# Patient Record
Sex: Female | Born: 1965 | Race: White | Hispanic: No | Marital: Married | Smoking: Never smoker
Health system: Southern US, Community
[De-identification: ages and names within clinical notes are randomized; demographics above are authoritative.]

## PROBLEM LIST (undated history)

## (undated) DIAGNOSIS — J45909 Unspecified asthma, uncomplicated: Secondary | ICD-10-CM

## (undated) HISTORY — DX: Unspecified asthma, uncomplicated: J45.909

---

## 2005-10-30 ENCOUNTER — Ambulatory Visit: Payer: Self-pay

## 2013-01-27 ENCOUNTER — Emergency Department: Payer: Self-pay | Admitting: Emergency Medicine

## 2014-06-01 LAB — HM PAP SMEAR: HM Pap smear: NEGATIVE

## 2014-07-02 ENCOUNTER — Ambulatory Visit: Payer: Self-pay

## 2014-07-02 LAB — HM MAMMOGRAPHY: HM Mammogram: NEGATIVE

## 2014-08-31 DIAGNOSIS — J4520 Mild intermittent asthma, uncomplicated: Secondary | ICD-10-CM | POA: Insufficient documentation

## 2015-09-07 ENCOUNTER — Encounter: Payer: Self-pay | Admitting: Family Medicine

## 2015-09-07 ENCOUNTER — Ambulatory Visit (INDEPENDENT_AMBULATORY_CARE_PROVIDER_SITE_OTHER): Payer: BLUE CROSS/BLUE SHIELD | Admitting: Family Medicine

## 2015-09-07 VITALS — BP 133/82 | HR 81 | Temp 98.4°F | Ht 59.4 in | Wt 207.0 lb

## 2015-09-07 DIAGNOSIS — F329 Major depressive disorder, single episode, unspecified: Secondary | ICD-10-CM | POA: Diagnosis not present

## 2015-09-07 DIAGNOSIS — F32A Depression, unspecified: Secondary | ICD-10-CM

## 2015-09-07 DIAGNOSIS — G4700 Insomnia, unspecified: Secondary | ICD-10-CM | POA: Diagnosis not present

## 2015-09-07 DIAGNOSIS — Z13220 Encounter for screening for lipoid disorders: Secondary | ICD-10-CM | POA: Diagnosis not present

## 2015-09-07 DIAGNOSIS — Z1239 Encounter for other screening for malignant neoplasm of breast: Secondary | ICD-10-CM

## 2015-09-07 DIAGNOSIS — R51 Headache: Secondary | ICD-10-CM

## 2015-09-07 DIAGNOSIS — R5382 Chronic fatigue, unspecified: Secondary | ICD-10-CM

## 2015-09-07 DIAGNOSIS — Z113 Encounter for screening for infections with a predominantly sexual mode of transmission: Secondary | ICD-10-CM | POA: Diagnosis not present

## 2015-09-07 DIAGNOSIS — R0683 Snoring: Secondary | ICD-10-CM | POA: Diagnosis not present

## 2015-09-07 DIAGNOSIS — Z1211 Encounter for screening for malignant neoplasm of colon: Secondary | ICD-10-CM | POA: Diagnosis not present

## 2015-09-07 DIAGNOSIS — E669 Obesity, unspecified: Secondary | ICD-10-CM | POA: Diagnosis not present

## 2015-09-07 DIAGNOSIS — R519 Headache, unspecified: Secondary | ICD-10-CM

## 2015-09-07 NOTE — Patient Instructions (Addendum)
Please come in in 4-6 weeks for physical. Please come in fasting so we can check your cholesterol.  Adjustment Disorder Adjustment disorder is an unusually severe reaction to a stressful life event, such as the loss of a job or physical illness. The event may be any stressful event other than the loss of a loved one. Adjustment disorder may affect your feelings, your thinking, how you act, or a combination of these. It may interfere with personal relationships or with the way you are at work, school, or home. People with this disorder are at risk for suicide and substance abuse. They may develop a more serious mental disorder, such as major depressive disorder or post-traumatic stress disorder. SIGNS AND SYMPTOMS  Symptoms may include:  Sadness, depressed mood, or crying spells.  Loss of enjoyment.  Change in appetite or weight.  Sense of loss or hopelessness.  Thoughts of suicide.  Anxiety, worry, or nervousness.  Trouble sleeping.  Avoiding family and friends.  Poor school performance.  Fighting or vandalism.  Reckless driving.  Skipping school.  Poor work International aid/development workerperformance.  Ignoring bills. Symptoms of adjustment disorder start within 3 months of the stressful life event. They do not last more than 6 months after the event has ended. DIAGNOSIS  To make a diagnosis, your health care provider will ask about what has happened in your life and how it has affected you. He or she may also ask about your medical history and use of medicines, alcohol, and other substances. Your health care provider may do a physical exam and order lab tests or other studies. You may be referred to a mental health specialist for evaluation. TREATMENT  Treatment options include:  Counseling or talk therapy. Talk therapy is usually provided by mental health specialists.  Medicine. Certain medicines may help with depression, anxiety, and sleep.  Support groups. Support groups offer emotional support,  advice, and guidance. They are made up of people who have had similar experiences. HOME CARE INSTRUCTIONS  Keep all follow-up visits as directed by your health care provider. This is important.  Take medicines only as directed by your health care provider. SEEK MEDICAL CARE IF:  Your symptoms get worse.  SEEK IMMEDIATE MEDICAL CARE IF: You have serious thoughts about hurting yourself or someone else. MAKE SURE YOU:  Understand these instructions.  Will watch your condition.  Will get help right away if you are not doing well or get worse.   This information is not intended to replace advice given to you by your health care provider. Make sure you discuss any questions you have with your health care provider.   Document Released: 06/06/2006 Document Revised: 10/23/2014 Document Reviewed: 02/24/2014 Elsevier Interactive Patient Education [employer]2016 [employer] Inc.

## 2015-09-07 NOTE — Progress Notes (Signed)
BP 133/82 mmHg  Pulse 81  Temp(Src) 98.4 F (36.9 C)  Ht 4' 11.4" (1.509 m)  Wt 207 lb (93.895 kg)  BMI 41.23 kg/m2  SpO2 99%  LMP 08/09/2015 (LMP Unknown)   Subjective:    Patient ID: Marissa Reynolds, female    DOB: Sep 05, 1966, 49 y.o.   MRN: [id number]  HPI: Marissa Louammy Roberson is a 49 y.o. female who presents today to establish care  Chief Complaint  Patient presents with  . Headache    Patient states that she has hd a headache continously since 08/13/15  . Weight Gain    Patient has gained about 50lbs since April  . Insomnia    Patient states that she cant sleep through the night   HEADACHES- never had headaches before, now getting worse, but doesn't really go away. Tried ibuprofen and it took the edge off, but didn't really make it go away, has been 2 years since eye exam Duration: almost a month Onset: sudden Severity: 2/10- 8/10 Quality: dull and aching Frequency: constant Location: back of her head Headache duration: constant, waxes and wanes Radiation: no Time of day headache occurs:  Alleviating factors: sleep Aggravating factors: nothing Headache status at time of visit: current headache Treatments attempted:  rest and ibuprofen   Aura: no Nausea:  no Vomiting: no Photophobia:  no Phonophobia:  no Effect on social functioning:  no Confusion:  no Gait disturbance/ataxia:  no Behavioral changes:  no Fevers:  no  Has put on 50lbs since April. Had been down to 162 with her scale, had lost weight since January from 196. Now not spending as much time with her husband because she's not cooking any more. Went of vacation and didn't work on it and has gained all the weight back and then some. Has been feeling really hungry. She feels like she over eats until she is uncomfortable.  INSOMNIA Duration: chronic Satisfied with sleep quality: no Difficulty falling asleep: no Difficulty staying asleep: yes Waking a few hours after sleep onset: yes Early morning awakenings:  no Daytime hypersomnolence: no Wakes feeling refreshed: no Good sleep hygiene: no Apnea: yes Snoring: yes Depressed/anxious mood: yes Recent stress: no Restless legs/nocturnal leg cramps: no Chronic pain/arthritis: no History of sleep study: no Treatments attempted: benadryl     DEPRESSION Mood status: uncontrolled for about the last month. Has not been able to spend as much time with her husband as she would like Satisfied with current treatment?:  Not on anything  Symptom severity: moderate  Duration of current treatment : never been on anything Psychotherapy/counseling: no  Previous psychiatric medications: none Depressed mood: yes Anxious mood: no Anhedonia: no Significant weight loss or gain: yes Insomnia: yes hard to stay asleep Fatigue: yes Feelings of worthlessness or guilt: no Impaired concentration/indecisiveness: no Suicidal ideations: no Hopelessness: no Crying spells: no [facility] 09/07/2015 09/07/2015  Decreased Interest 2 2  Down, Depressed, Hopeless 2 2  PHQ - 2 Score 4 4  Altered sleeping 3 3  Tired, decreased energy 3 3  Change in appetite 3 3  Feeling bad or failure about yourself  1 1  Trouble concentrating 1 1  Moving slowly or fidgety/restless 1 1  Suicidal thoughts 0 0  PHQ-9 Score 16 16  Difficult doing work/chores Somewhat difficult Somewhat difficult   Relevant past medical, surgical, family and social history reviewed and updated as indicated. Interim medical history since our last visit reviewed. Allergies and medications reviewed and updated.  Review of Systems  Constitutional: Negative.   HENT: Negative.   Respiratory: Negative.   Cardiovascular: Negative.   Musculoskeletal: Negative.   Psychiatric/Behavioral: Negative.     Per HPI unless specifically indicated above     Objective:    BP 133/82 mmHg  Pulse 81  Temp(Src) 98.4 F (36.9 C)  Ht 4' 11.4" (1.509 m)  Wt 207 lb (93.895 kg)  BMI 41.23 kg/m2   SpO2 99%  LMP 08/09/2015 (LMP Unknown)  Wt Readings from Last 3 Encounters:  09/07/15 207 lb (93.895 kg)    Physical Exam  Constitutional: She is oriented to person, place, and time. She appears well-developed and well-nourished. No distress.  HENT:  Head: Normocephalic and atraumatic.  Right Ear: Hearing normal.  Left Ear: Hearing normal.  Nose: Nose normal.  Eyes: Conjunctivae and lids are normal. Right eye exhibits no discharge. Left eye exhibits no discharge. No scleral icterus.  Cardiovascular: Normal rate, regular rhythm, normal heart sounds and intact distal pulses.  Exam reveals no gallop and no friction rub.   No murmur heard. Pulmonary/Chest: Effort normal and breath sounds normal. No respiratory distress. She has no wheezes. She has no rales. She exhibits no tenderness.  Musculoskeletal: Normal range of motion.  Neurological: She is alert and oriented to person, place, and time.  Skin: Skin is warm, dry and intact. No rash noted.  Psychiatric: She has a normal mood and affect. Her speech is normal and behavior is normal. Judgment and thought content normal. Cognition and memory are normal.  Nursing note and vitals reviewed.   No results found for this or any previous visit.    Assessment & Plan:   Problem List Items Addressed This Visit    None    Visit Diagnoses    Depression    -  Primary    Seems to be more of an adjustment disorder as her husband is now working an opposite shift to her. Will monitor and recheck in 1 month.     Relevant Orders    CBC with Differential/Platelet    Comprehensive metabolic panel    TSH    T3    T4    Ambulatory referral to Sleep Studies    Breast cancer screening        Mammogram ordered today.     Relevant Orders    MM DIGITAL SCREENING BILATERAL    Colon cancer screening        Colonoscopy ordered today.     Relevant Orders    Cologuard    Routine screening for STI (sexually transmitted infection)        Hep C and HIV  ordered today.     Relevant Orders    HIV antibody    Hepatitis C Antibody    Obesity        Will check labs. Seems to be due to dietary issues. Work on diet and exercise. Will check sleep study.    Relevant Orders    Comprehensive metabolic panel    TSH    T3    T4    Ambulatory referral to Sleep Studies    Chronic fatigue        Will check labs and check a sleep study. Await results. Continue to monitor.     Relevant Orders    CBC with Differential/Platelet    TSH    T3    T4    Ambulatory referral to Sleep Studies    Insomnia  Of unclear origin. Will check labs and ill obtain sleep study. Await results.     Relevant Orders    Ambulatory referral to Sleep Studies    Snoring        Obtain sleep study. Await results.     Relevant Orders    Ambulatory referral to Sleep Studies    Acute nonintractable headache, unspecified headache type        Will see eye doctor. Will obtain sleep study. Continue to monitor.     Screening for cholesterol level        Will check next visit. Ordered.     Relevant Orders    Lipid Panel Piccolo, Waived        Follow up plan: Return 4-6 weeks for PE, for Records release from OBGYN.

## 2015-09-08 ENCOUNTER — Encounter: Payer: Self-pay | Admitting: Family Medicine

## 2015-09-08 LAB — COMPREHENSIVE METABOLIC PANEL
ALBUMIN: 4.1 g/dL (ref 3.5–5.5)
ALT: 31 IU/L (ref 0–32)
AST: 27 IU/L (ref 0–40)
Albumin/Globulin Ratio: 1.5 (ref 1.1–2.5)
Alkaline Phosphatase: 52 IU/L (ref 39–117)
BUN / CREAT RATIO: 20 (ref 9–23)
BUN: 12 mg/dL (ref 6–24)
Bilirubin Total: <0.2 mg/dL (ref 0.0–1.2)
CALCIUM: 9.5 mg/dL (ref 8.7–10.2)
CO2: 30 mmol/L — ABNORMAL HIGH (ref 18–29)
CREATININE: 0.59 mg/dL (ref 0.57–1.00)
Chloride: 100 mmol/L (ref 97–106)
GFR, EST AFRICAN AMERICAN: 123 mL/min/{1.73_m2} (ref >59)
GFR, EST NON AFRICAN AMERICAN: 106 mL/min/{1.73_m2} (ref >59)
GLUCOSE: 84 mg/dL (ref 65–99)
Globulin, Total: 2.8 g/dL (ref 1.5–4.5)
Potassium: 4.2 mmol/L (ref 3.5–5.2)
Sodium: 140 mmol/L (ref 136–144)
TOTAL PROTEIN: 6.9 g/dL (ref 6.0–8.5)

## 2015-09-08 LAB — CBC WITH DIFFERENTIAL/PLATELET
BASOS ABS: 0 10*3/uL (ref 0.0–0.2)
BASOS: 1 %
EOS (ABSOLUTE): 0.3 10*3/uL (ref 0.0–0.4)
Eos: 4 %
HEMOGLOBIN: 12 g/dL (ref 11.1–15.9)
Hematocrit: 36 % (ref 34.0–46.6)
IMMATURE GRANS (ABS): 0 10*3/uL (ref 0.0–0.1)
IMMATURE GRANULOCYTES: 0 %
LYMPHS: 32 %
Lymphocytes Absolute: 2.5 10*3/uL (ref 0.7–3.1)
MCH: 29.1 pg (ref 26.6–33.0)
MCHC: 33.3 g/dL (ref 31.5–35.7)
MCV: 87 fL (ref 79–97)
MONOCYTES: 8 %
Monocytes Absolute: 0.6 10*3/uL (ref 0.1–0.9)
NEUTROS ABS: 4.5 10*3/uL (ref 1.4–7.0)
NEUTROS PCT: 55 %
PLATELETS: 276 10*3/uL (ref 150–379)
RBC: 4.13 x10E6/uL (ref 3.77–5.28)
RDW: 15.9 % — ABNORMAL HIGH (ref 12.3–15.4)
WBC: 7.9 10*3/uL (ref 3.4–10.8)

## 2015-09-08 LAB — HIV ANTIBODY (ROUTINE TESTING W REFLEX): HIV Screen 4th Generation wRfx: NONREACTIVE

## 2015-09-08 LAB — T4: T4, Total: 6.4 ug/dL (ref 4.5–12.0)

## 2015-09-08 LAB — TSH: TSH: 2.7 u[IU]/mL (ref 0.450–4.500)

## 2015-09-08 LAB — T3: T3, Total: 113 ng/dL (ref 71–180)

## 2015-09-08 LAB — HEPATITIS C ANTIBODY: Hep C Virus Ab: <0.1 s/co ratio (ref 0.0–0.9)

## 2015-09-14 ENCOUNTER — Encounter: Payer: Self-pay | Admitting: Family Medicine

## 2015-09-25 LAB — COLOGUARD: Cologuard: NEGATIVE

## 2015-10-19 ENCOUNTER — Ambulatory Visit (INDEPENDENT_AMBULATORY_CARE_PROVIDER_SITE_OTHER): Payer: BLUE CROSS/BLUE SHIELD | Admitting: Family Medicine

## 2015-10-19 ENCOUNTER — Encounter: Payer: Self-pay | Admitting: Family Medicine

## 2015-10-19 VITALS — BP 137/82 | HR 72 | Temp 98.7°F | Ht 59.3 in | Wt 207.0 lb

## 2015-10-19 DIAGNOSIS — Z13220 Encounter for screening for lipoid disorders: Secondary | ICD-10-CM | POA: Diagnosis not present

## 2015-10-19 DIAGNOSIS — Z23 Encounter for immunization: Secondary | ICD-10-CM

## 2015-10-19 DIAGNOSIS — E785 Hyperlipidemia, unspecified: Secondary | ICD-10-CM

## 2015-10-19 DIAGNOSIS — Z0000 Encounter for general adult medical examination without abnormal findings: Secondary | ICD-10-CM | POA: Diagnosis not present

## 2015-10-19 LAB — LIPID PANEL PICCOLO, WAIVED
CHOL/HDL RATIO PICCOLO,WAIVE: 3.5 mg/dL
Cholesterol Piccolo, Waived: 218 mg/dL — ABNORMAL HIGH (ref <200)
HDL Chol Piccolo, Waived: 62 mg/dL (ref >59)
LDL Chol Calc Piccolo Waived: 135 mg/dL — ABNORMAL HIGH (ref <100)
TRIGLYCERIDES PICCOLO,WAIVED: 107 mg/dL (ref <150)
VLDL CHOL CALC PICCOLO,WAIVE: 21 mg/dL (ref <30)

## 2015-10-19 NOTE — Progress Notes (Signed)
BP 153/90 mmHg  Pulse 83  Temp(Src) 98.7 F (37.1 C)  Ht 4' 11.3" (1.506 m)  Wt 207 lb (93.895 kg)  BMI 41.40 kg/m2  SpO2 100%  LMP 10/10/2015 (Approximate)   Subjective:    Patient ID: Marissa Reynolds, female    DOB: [birth year], 50 y.o.   MRN: [id number]  HPI: Marissa Louammy Bodley is a 50 y.o. female presenting on 10/19/2015 for comprehensive medical examination. Current medical complaints include: none  She currently lives with: husband Menopausal Symptoms: irregular bleeding, [facility] at Christmas[facility] before [facility]  Depression Screen done today and results listed below:  [facility] 10/19/2015 09/07/2015 09/07/2015  Decreased Interest 0 2 2  Down, Depressed, Hopeless 0 2 2  PHQ - 2 Score 0 4 4  Altered sleeping 2 3 3   Tired, decreased energy 0 3 3  Change in appetite 0 3 3  Feeling bad or failure about yourself  0 1 1  Trouble concentrating 0 1 1  Moving slowly or fidgety/restless 0 1 1  Suicidal thoughts 0 0 0  PHQ-9 Score 2 16 16   Difficult doing work/chores Somewhat difficult Somewhat difficult Somewhat difficult   Past Medical History:  Past Medical History  Diagnosis Date  . Asthma     Surgical History:  Past Surgical History  Procedure Laterality Date  . Cesarean section     Medications:  Current Outpatient Prescriptions on File Prior to Visit  Medication Sig  . albuterol (PROVENTIL HFA;VENTOLIN HFA) 108 (90 BASE) MCG/ACT inhaler Inhale 2 puffs into the lungs every 6 (six) hours as needed for wheezing or shortness of breath.  . B Complex Vitamins (B-COMPLEX/B-12) TABS Take by mouth.  . beclomethasone (QVAR) 80 MCG/ACT inhaler Inhale 1 puff into the lungs 2 (two) times daily.  Marland Kitchen. BIOTIN 5000 PO Take 10,000 Units by mouth.  . Multiple Vitamin (MULTIVITAMIN) tablet Take 1 tablet by mouth daily.  Marland Kitchen. POTASSIUM GLUCONATE PO Take 1,100 mg by mouth.   No current facility-administered medications on file prior to visit.    Allergies:   No Known Allergies  Social History:  Social History   Social History  . Marital Status: Married    Spouse Name: N/A  . Number of Children: N/A  . Years of Education: N/A   Occupational History  . Not on file.   Social History Main Topics  . Smoking status: Never Smoker   . Smokeless tobacco: Never Used  . Alcohol Use: No  . Drug Use: No  . Sexual Activity: Yes    Birth Control/ Protection: None   Other Topics Concern  . Not on file   Social History Narrative   History  Smoking status  . Never Smoker   Smokeless tobacco  . Never Used   History  Alcohol Use No    Family History:  Family History  Problem Relation Age of Onset  . Heart attack Father   . Kidney disease Father   . Stroke Maternal Grandmother   . Diabetes Maternal Grandfather     Past medical history, surgical history, medications, allergies, family history and social history reviewed with patient today and changes made to appropriate areas of the chart.   Review of Systems  Constitutional: Negative.   HENT: Positive for congestion. Negative for ear discharge, ear pain, hearing loss, nosebleeds, sore throat and tinnitus.   Eyes: Positive for blurred vision. Negative for double vision, photophobia, pain and discharge.  Respiratory: Negative.  Negative for stridor.  Cardiovascular: Negative.   Gastrointestinal: Negative.   Genitourinary: Negative.   Musculoskeletal: Negative.   Skin: Negative.   Neurological: Positive for headaches. Negative for dizziness, tingling, tremors, sensory change, speech change, focal weakness, seizures and loss of consciousness.  Endo/Heme/Allergies: Negative.   Psychiatric/Behavioral: Negative.     All other ROS negative except what is listed above and in the HPI.      Objective:    BP 153/90 mmHg  Pulse 83  Temp(Src) 98.7 F (37.1 C)  Ht 4' 11.3" (1.506 m)  Wt 207 lb (93.895 kg)  BMI 41.40 kg/m2  SpO2 100%  LMP 10/10/2015 (Approximate)  Wt Readings  from Last 3 Encounters:  10/19/15 207 lb (93.895 kg)  09/07/15 207 lb (93.895 kg)    Physical Exam  Constitutional: She is oriented to person, place, and time. She appears well-developed and well-nourished. No distress.  HENT:  Head: Normocephalic and atraumatic.  Right Ear: Hearing and external ear normal.  Left Ear: Hearing and external ear normal.  Nose: Nose normal.  Mouth/Throat: Oropharynx is clear and moist. No oropharyngeal exudate.  Eyes: Conjunctivae, EOM and lids are normal. Pupils are equal, round, and reactive to light. Right eye exhibits no discharge. Left eye exhibits no discharge. No scleral icterus.  Neck: Normal range of motion. Neck supple. No JVD present. No tracheal deviation present. No thyromegaly present.  Cardiovascular: Normal rate, regular rhythm, normal heart sounds and intact distal pulses.  Exam reveals no gallop and no friction rub.   No murmur heard. Pulmonary/Chest: Effort normal and breath sounds normal. No stridor. No respiratory distress. She has no wheezes. She has no rales. She exhibits no tenderness. Right breast exhibits no inverted nipple, no mass, no nipple discharge, no skin change and no tenderness. Left breast exhibits no inverted nipple, no mass, no nipple discharge, no skin change and no tenderness. Breasts are symmetrical.  Abdominal: Soft. Bowel sounds are normal. She exhibits no distension and no mass. There is no tenderness. There is no rebound and no guarding.  Genitourinary:  Deferred with shared decision making  Musculoskeletal: Normal range of motion. She exhibits no edema or tenderness.  Lymphadenopathy:    She has no cervical adenopathy.  Neurological: She is alert and oriented to person, place, and time. She has normal reflexes. She displays normal reflexes. No cranial nerve deficit. She exhibits normal muscle tone. Coordination normal.  Skin: Skin is warm, dry and intact. No rash noted. She is not diaphoretic. No erythema. No pallor.   Psychiatric: She has a normal mood and affect. Her speech is normal and behavior is normal. Judgment and thought content normal. Cognition and memory are normal.  Nursing note and vitals reviewed.   Results for orders placed or performed in visit on 09/14/15  HM MAMMOGRAPHY  Result Value Ref Range   HM Mammogram Negative   HM PAP SMEAR  Result Value Ref Range   HM Pap smear Negative with co-testing       Assessment & Plan:   Problem List Items Addressed This Visit      Other   Hyperlipidemia    Rechecking levels today       Other Visit Diagnoses    Routine general medical examination at a health care facility    -  Primary    Up to date on vaccines and labs. Work on diet and exercise. Pap up to date. Mammo ordered.     Immunization due        Tdap given today  Relevant Orders    Tdap vaccine greater than or equal to 7yo IM    Screening for cholesterol level        Will check next visit. Ordered.         Follow up plan: Return in about 1 year (around 10/18/2016) for PE.   LABORATORY TESTING:  - Pap smear: up to date  IMMUNIZATIONS:   - Tdap: Tetanus vaccination status reviewed: Td vaccination indicated and given today. - Influenza: Refused  SCREENING: -Mammogram: Ordered today  - Colonoscopy: Up to date   PATIENT COUNSELING:   Advised to take 1 mg of folate supplement per day if capable of pregnancy.   Sexuality: Discussed sexually transmitted diseases, partner selection, use of condoms, avoidance of unintended pregnancy  and contraceptive alternatives.   Advised to avoid cigarette smoking.  I discussed with the patient that most people either abstain from alcohol or drink within safe limits (<=14/week and <=4 drinks/occasion for males, <=7/weeks and <= 3 drinks/occasion for females) and that the risk for alcohol disorders and other health effects rises proportionally with the number of drinks per week and how often a drinker exceeds daily  limits.  Discussed cessation/primary prevention of drug use and availability of treatment for abuse.   Diet: Encouraged to adjust caloric intake to maintain  or achieve ideal body weight, to reduce intake of dietary saturated fat and total fat, to limit sodium intake by avoiding high sodium foods and not adding table salt, and to maintain adequate dietary potassium and calcium preferably from fresh fruits, vegetables, and low-fat dairy products.    stressed the importance of regular exercise  Injury prevention: Discussed safety belts, safety helmets, smoke detector, smoking near bedding or upholstery.   Dental health: Discussed importance of regular tooth brushing, flossing, and dental visits.    NEXT PREVENTATIVE PHYSICAL DUE IN 1 YEAR. Return in about 1 year (around 10/18/2016) for PE.

## 2015-10-19 NOTE — Patient Instructions (Signed)
Health Maintenance, Female Adopting a healthy lifestyle and getting preventive care can go a long way to promote health and wellness. Talk with your health care provider about what schedule of regular examinations is right for you. This is a good chance for you to check in with your provider about disease prevention and staying healthy. In between checkups, there are plenty of things you can do on your own. Experts have done a lot of research about which lifestyle changes and preventive measures are most likely to keep you healthy. Ask your health care provider for more information. WEIGHT AND DIET  Eat a healthy diet  Be sure to include plenty of vegetables, fruits, low-fat dairy products, and lean protein.  Do not eat a lot of foods high in solid fats, added sugars, or salt.  Get regular exercise. This is one of the most important things you can do for your health.  Most adults should exercise for at least 150 minutes each week. The exercise should increase your heart rate and make you sweat (moderate-intensity exercise).  Most adults should also do strengthening exercises at least twice a week. This is in addition to the moderate-intensity exercise.  Maintain a healthy weight  Body mass index (BMI) is a measurement that can be used to identify possible weight problems. It estimates body fat based on height and weight. Your health care provider can help determine your BMI and help you achieve or maintain a healthy weight.  For females 20 years of age and older:   A BMI below 18.5 is considered underweight.  A BMI of 18.5 to 24.9 is normal.  A BMI of 25 to 29.9 is considered overweight.  A BMI of 30 and above is considered obese.  Watch levels of cholesterol and blood lipids  You should start having your blood tested for lipids and cholesterol at 52 years of age, then have this test every 5 years.  You may need to have your cholesterol levels checked more often if:  Your lipid  or cholesterol levels are high.  You are older than 52 years of age.  You are at high risk for heart disease.  CANCER SCREENING   Lung Cancer  Lung cancer screening is recommended for adults 55-80 years old who are at high risk for lung cancer because of a history of smoking.  A yearly low-dose CT scan of the lungs is recommended for people who:  Currently smoke.  Have quit within the past 15 years.  Have at least a 30-pack-year history of smoking. A pack year is smoking an average of one pack of cigarettes a day for 1 year.  Yearly screening should continue until it has been 15 years since you quit.  Yearly screening should stop if you develop a health problem that would prevent you from having lung cancer treatment.  Breast Cancer  Practice breast self-awareness. This means understanding how your breasts normally appear and feel.  It also means doing regular breast self-exams. Let your health care provider know about any changes, no matter how small.  If you are in your 20s or 30s, you should have a clinical breast exam (CBE) by a health care provider every 1-3 years as part of a regular health exam.  If you are 40 or older, have a CBE every year. Also consider having a breast X-ray (mammogram) every year.  If you have a family history of breast cancer, talk to your health care provider about genetic screening.  If you   are at high risk for breast cancer, talk to your health care provider about having an MRI and a mammogram every year.  Breast cancer gene (BRCA) assessment is recommended for women who have family members with BRCA-related cancers. BRCA-related cancers include:  Breast.  Ovarian.  Tubal.  Peritoneal cancers.  Results of the assessment will determine the need for genetic counseling and BRCA1 and BRCA2 testing. Cervical Cancer Your health care provider may recommend that you be screened regularly for cancer of the pelvic organs (ovaries, uterus, and  vagina). This screening involves a pelvic examination, including checking for microscopic changes to the surface of your cervix (Pap test). You may be encouraged to have this screening done every 3 years, beginning at age 21.  For women ages 30-65, health care providers may recommend pelvic exams and Pap testing every 3 years, or they may recommend the Pap and pelvic exam, combined with testing for human papilloma virus (HPV), every 5 years. Some types of HPV increase your risk of cervical cancer. Testing for HPV may also be done on women of any age with unclear Pap test results.  Other health care providers may not recommend any screening for nonpregnant women who are considered low risk for pelvic cancer and who do not have symptoms. Ask your health care provider if a screening pelvic exam is right for you.  If you have had past treatment for cervical cancer or a condition that could lead to cancer, you need Pap tests and screening for cancer for at least 20 years after your treatment. If Pap tests have been discontinued, your risk factors (such as having a new sexual partner) need to be reassessed to determine if screening should resume. Some women have medical problems that increase the chance of getting cervical cancer. In these cases, your health care provider may recommend more frequent screening and Pap tests. Colorectal Cancer  This type of cancer can be detected and often prevented.  Routine colorectal cancer screening usually begins at 52 years of age and continues through 52 years of age.  Your health care provider may recommend screening at an earlier age if you have risk factors for colon cancer.  Your health care provider may also recommend using home test kits to check for hidden blood in the stool.  A small camera at the end of a tube can be used to examine your colon directly (sigmoidoscopy or colonoscopy). This is done to check for the earliest forms of colorectal  cancer.  Routine screening usually begins at age 50.  Direct examination of the colon should be repeated every 5-10 years through 52 years of age. However, you may need to be screened more often if early forms of precancerous polyps or small growths are found. Skin Cancer  Check your skin from head to toe regularly.  Tell your health care provider about any new moles or changes in moles, especially if there is a change in a mole's shape or color.  Also tell your health care provider if you have a mole that is larger than the size of a pencil eraser.  Always use sunscreen. Apply sunscreen liberally and repeatedly throughout the day.  Protect yourself by wearing long sleeves, pants, a wide-brimmed hat, and sunglasses whenever you are outside. HEART DISEASE, DIABETES, AND HIGH BLOOD PRESSURE   High blood pressure causes heart disease and increases the risk of stroke. High blood pressure is more likely to develop in:  People who have blood pressure in the high end   of the normal range (130-139/85-89 mm Hg).  People who are overweight or obese.  People who are African American.  If you are 38-23 years of age, have your blood pressure checked every 3-5 years. If you are 61 years of age or older, have your blood pressure checked every year. You should have your blood pressure measured twice--once when you are at a hospital or clinic, and once when you are not at a hospital or clinic. Record the average of the two measurements. To check your blood pressure when you are not at a hospital or clinic, you can use:  An automated blood pressure machine at a pharmacy.  A home blood pressure monitor.  If you are between 45 years and 39 years old, ask your health care provider if you should take aspirin to prevent strokes.  Have regular diabetes screenings. This involves taking a blood sample to check your fasting blood sugar level.  If you are at a normal weight and have a low risk for diabetes,  have this test once every three years after 52 years of age.  If you are overweight and have a high risk for diabetes, consider being tested at a younger age or more often. PREVENTING INFECTION  Hepatitis B  If you have a higher risk for hepatitis B, you should be screened for this virus. You are considered at high risk for hepatitis B if:  You were born in a country where hepatitis B is common. Ask your health care provider which countries are considered high risk.  Your parents were born in a high-risk country, and you have not been immunized against hepatitis B (hepatitis B vaccine).  You have HIV or AIDS.  You use needles to inject street drugs.  You live with someone who has hepatitis B.  You have had sex with someone who has hepatitis B.  You get hemodialysis treatment.  You take certain medicines for conditions, including cancer, organ transplantation, and autoimmune conditions. Hepatitis C  Blood testing is recommended for:  Everyone born from [birth year] through 1967.  Anyone with known risk factors for hepatitis C. Sexually transmitted infections (STIs)  You should be screened for sexually transmitted infections (STIs) including gonorrhea and chlamydia if:  You are sexually active and are younger than 52 years of age.  You are older than 53 years of age and your health care provider tells you that you are at risk for this type of infection.  Your sexual activity has changed since you were last screened and you are at an increased risk for chlamydia or gonorrhea. Ask your health care provider if you are at risk.  If you do not have HIV, but are at risk, it may be recommended that you take a prescription medicine daily to prevent HIV infection. This is called pre-exposure prophylaxis (PrEP). You are considered at risk if:  You are sexually active and do not regularly use condoms or know the HIV status of your partner(s).  You take drugs by injection.  You are sexually  active with a partner who has HIV. Talk with your health care provider about whether you are at high risk of being infected with HIV. If you choose to begin PrEP, you should first be tested for HIV. You should then be tested every 3 months for as long as you are taking PrEP.  PREGNANCY   If you are premenopausal and you may become pregnant, ask your health care provider about preconception counseling.  If you may  become pregnant, take 400 to 800 micrograms (mcg) of folic acid every day.  If you want to prevent pregnancy, talk to your health care provider about birth control (contraception). OSTEOPOROSIS AND MENOPAUSE   Osteoporosis is a disease in which the bones lose minerals and strength with aging. This can result in serious bone fractures. Your risk for osteoporosis can be identified using a bone density scan.  If you are 61 years of age or older, or if you are at risk for osteoporosis and fractures, ask your health care provider if you should be screened.  Ask your health care provider whether you should take a calcium or vitamin D supplement to lower your risk for osteoporosis.  Menopause may have certain physical symptoms and risks.  Hormone replacement therapy may reduce some of these symptoms and risks. Talk to your health care provider about whether hormone replacement therapy is right for you.  HOME CARE INSTRUCTIONS   Schedule regular health, dental, and eye exams.  Stay current with your immunizations.   Do not use any tobacco products including cigarettes, chewing tobacco, or electronic cigarettes.  If you are pregnant, do not drink alcohol.  If you are breastfeeding, limit how much and how often you drink alcohol.  Limit alcohol intake to no more than 1 drink per day for nonpregnant women. One drink equals 12 ounces of beer, 5 ounces of wine, or 1 ounces of hard liquor.  Do not use street drugs.  Do not share needles.  Ask your health care provider for help if  you need support or information about quitting drugs.  Tell your health care provider if you often feel depressed.  Tell your health care provider if you have ever been abused or do not feel safe at home.   This information is not intended to replace advice given to you by your health care provider. Make sure you discuss any questions you have with your health care provider.   Document Released: 04/17/2011 Document Revised: 10/23/2014 Document Reviewed: 09/03/2013 Elsevier Interactive Patient Education [employer].

## 2015-10-19 NOTE — Assessment & Plan Note (Signed)
Rechecking levels today. 

## 2015-11-12 ENCOUNTER — Telehealth: Payer: Self-pay | Admitting: Neurology

## 2015-11-12 NOTE — Telephone Encounter (Signed)
Pt called inquiring how much money to bring for sleep consult. She has $4k ded, she has met $1k of it. She needs to know what amount of money she is expected to bring at [facility]. Please call and leave message on cell phone # [phone]. She cannot talk on phone during day except between 12-1:30.

## 2015-11-16 ENCOUNTER — Institutional Professional Consult (permissible substitution): Payer: Self-pay | Admitting: Neurology

## 2015-12-14 ENCOUNTER — Institutional Professional Consult (permissible substitution): Payer: Self-pay | Admitting: Neurology

## 2016-01-25 ENCOUNTER — Ambulatory Visit (INDEPENDENT_AMBULATORY_CARE_PROVIDER_SITE_OTHER): Payer: BLUE CROSS/BLUE SHIELD | Admitting: Family Medicine

## 2016-01-25 ENCOUNTER — Encounter: Payer: Self-pay | Admitting: Family Medicine

## 2016-01-25 VITALS — BP 123/75 | HR 75 | Temp 99.6°F | Ht 60.0 in | Wt 204.0 lb

## 2016-01-25 DIAGNOSIS — E669 Obesity, unspecified: Secondary | ICD-10-CM

## 2016-01-25 DIAGNOSIS — L989 Disorder of the skin and subcutaneous tissue, unspecified: Secondary | ICD-10-CM

## 2016-01-25 DIAGNOSIS — N393 Stress incontinence (female) (male): Secondary | ICD-10-CM

## 2016-01-25 MED ORDER — NALTREXONE-BUPROPION HCL ER 8-90 MG PO TB12: ORAL_TABLET | ORAL | Status: DC

## 2016-01-25 NOTE — Progress Notes (Signed)
BP 123/75 mmHg  Pulse 75  Temp(Src) 99.6 F (37.6 C)  Ht 5' (1.524 m)  Wt 204 lb (92.534 kg)  BMI 39.84 kg/m2  SpO2 99%  LMP  (LMP Unknown)   Subjective:    Patient ID: Marissa Reynolds, female    DOB: 01-03-1966, 50 y.o.   MRN: [id number]  HPI: Marissa Reynolds is a 50 y.o. female  Chief Complaint  Patient presents with  . Nevus    Patient states that the brown spot developed on her face as a flat spot, now it is raised and itchy   SKIN LESION Duration: couple of months Location: R cheek Painful: no Itching: yes Onset: gradual Context: bigger Associated signs and symptoms: Nothing History of skin cancer: no History of precancerous skin lesions: no Family history of skin cancer: yes  WEIGHT GAIN Duration: chronic Previous attempts at weight loss: yes Complications of obesity: hyperlipidemia Peak weight: 207 Weight loss goal: 30 lbs  Weight loss to date: 3lbs Requesting obesity pharmacotherapy: yes Current weight loss supplements/medications: no Previous weight loss supplements/meds: no  URINARY SYMPTOMS Duration: few years Dysuria: no Urinary frequency: no Urgency: yes Small volume voids: no Symptom severity: mild Urinary incontinence: yes Foul odor: no Hematuria: no Abdominal pain: no Back pain: no Suprapubic pain/pressure: no Flank pain: no Fever:  no Vomiting: no  Relevant past medical, surgical, family and social history reviewed and updated as indicated. Interim medical history since our last visit reviewed. Allergies and medications reviewed and updated.  Review of Systems  Constitutional: Negative.   Respiratory: Negative.   Cardiovascular: Negative.   Genitourinary: Positive for urgency. Negative for dysuria, frequency, hematuria, flank pain, decreased urine volume, vaginal bleeding, vaginal discharge, enuresis, difficulty urinating, genital sores, vaginal pain, menstrual problem, pelvic pain and dyspareunia.  Skin: Negative for color change,  pallor, rash and wound.   Per HPI unless specifically indicated above     Objective:    BP 123/75 mmHg  Pulse 75  Temp(Src) 99.6 F (37.6 C)  Ht 5' (1.524 m)  Wt 204 lb (92.534 kg)  BMI 39.84 kg/m2  SpO2 99%  LMP  (LMP Unknown)  Wt Readings from Last 3 Encounters:  01/25/16 204 lb (92.534 kg)  10/19/15 207 lb (93.895 kg)  09/07/15 207 lb (93.895 kg)    Physical Exam  Constitutional: She is oriented to person, place, and time. She appears well-developed and well-nourished. No distress.  HENT:  Head: Normocephalic and atraumatic.  Right Ear: Hearing normal.  Left Ear: Hearing normal.  Nose: Nose normal.  Eyes: Conjunctivae and lids are normal. Right eye exhibits no discharge. Left eye exhibits no discharge. No scleral icterus.  Pulmonary/Chest: Effort normal. No respiratory distress.  Musculoskeletal: Normal range of motion.  Neurological: She is alert and oriented to person, place, and time.  Skin: Skin is warm, dry and intact. No rash noted. No erythema. No pallor.  1cm slightly raised hyperpigmented lesion on the R lower cheek  Psychiatric: She has a normal mood and affect. Her speech is normal and behavior is normal. Judgment and thought content normal. Cognition and memory are normal.  Nursing note and vitals reviewed.   Results for orders placed or performed in visit on 10/19/15  Lipid Panel Piccolo, Arrow ElectronicsWaived  Result Value Ref Range   Cholesterol Piccolo, Waived 218 (H) <200 mg/dL   HDL Chol Piccolo, Waived 62 >59 mg/dL   Triglycerides Piccolo,Waived 107 <150 mg/dL   Chol/HDL Ratio Piccolo,Waive 3.5 mg/dL   LDL Chol Calc Piccolo Waived 135 (  H) <100 mg/dL   VLDL Chol Calc Piccolo,Waive 21 <30 mg/dL      Assessment & Plan:   Problem List Items Addressed This Visit      Other   Obesity    Will start contrave. Information given to patient today. Recheck in 1 month.       Relevant Medications   Naltrexone-Bupropion HCl ER (CONTRAVE) 8-90 MG TB12    Other  Visit Diagnoses    Skin lesion    -  Primary    Referral to dermatology made today as it on her face.     Relevant Orders    Ambulatory referral to Dermatology    Stress incontinence        Will try timed voiding. If not improving or worsening she will let us know.         Follow up plan: Return in about 4 weeks (around 02/22/2016) for weight check.

## 2016-01-25 NOTE — Assessment & Plan Note (Signed)
Will start contrave. Information given to patient today. Recheck in 1 month.

## 2016-02-03 ENCOUNTER — Telehealth: Payer: Self-pay

## 2016-02-03 NOTE — Telephone Encounter (Signed)
Patient called and wants to know if there's another medication she can take besides the contrave because her insurance won't cover it.

## 2016-02-03 NOTE — Telephone Encounter (Signed)
Patient will call insurance and see what they will cover and let us know

## 2016-02-03 NOTE — Telephone Encounter (Signed)
Likely not, unless we just do the wellbutrin. Have her call her insurance and find out. Thanks.

## 2016-03-07 ENCOUNTER — Ambulatory Visit: Payer: BLUE CROSS/BLUE SHIELD | Admitting: Family Medicine

## 2016-04-24 ENCOUNTER — Telehealth: Payer: Self-pay | Admitting: Family Medicine

## 2016-04-24 NOTE — Telephone Encounter (Signed)
She doesn't have any medical problems that require prophylactic antibiotics. Her dentist should be able to write one if he or she thinks she needs one. If they can't, and her dentist is recommending this, please let me knwo.

## 2016-04-24 NOTE — Telephone Encounter (Signed)
Called to let patient know pf Dr.Johnson's response.

## 2016-04-24 NOTE — Telephone Encounter (Signed)
Forward to provider

## 2016-04-24 NOTE — Telephone Encounter (Signed)
Pt has a tooth that needs to be pulled and she would like to get an antibiotic to prevent infection and facial swelling [facility]. Pt stated we can leave detailed message on voicemail because she is at work.

## 2016-05-23 ENCOUNTER — Ambulatory Visit: Payer: Self-pay | Admitting: Family Medicine

## 2016-10-20 ENCOUNTER — Encounter: Payer: Self-pay | Admitting: Family Medicine

## 2016-10-24 ENCOUNTER — Encounter: Payer: BLUE CROSS/BLUE SHIELD | Admitting: Family Medicine

## 2016-11-21 ENCOUNTER — Other Ambulatory Visit: Payer: Self-pay | Admitting: Internal Medicine

## 2016-11-21 DIAGNOSIS — Z1231 Encounter for screening mammogram for malignant neoplasm of breast: Secondary | ICD-10-CM

## 2016-12-19 ENCOUNTER — Ambulatory Visit: Payer: Self-pay

## 2017-01-16 ENCOUNTER — Other Ambulatory Visit: Payer: Self-pay | Admitting: Internal Medicine

## 2017-01-16 ENCOUNTER — Ambulatory Visit
Admission: RE | Admit: 2017-01-16 | Discharge: 2017-01-16 | Disposition: A | Discharge status: Home / Self Care | Payer: BLUE CROSS/BLUE SHIELD | Source: Ambulatory Visit | Attending: Internal Medicine | Admitting: Internal Medicine

## 2017-01-16 DIAGNOSIS — Z1231 Encounter for screening mammogram for malignant neoplasm of breast: Secondary | ICD-10-CM | POA: Diagnosis present

## 2017-05-01 ENCOUNTER — Encounter: Payer: Self-pay | Admitting: Family Medicine

## 2017-05-01 ENCOUNTER — Ambulatory Visit (INDEPENDENT_AMBULATORY_CARE_PROVIDER_SITE_OTHER): Payer: BLUE CROSS/BLUE SHIELD | Admitting: Family Medicine

## 2017-05-01 VITALS — BP 133/79 | HR 99 | Temp 98.6°F | Ht 60.3 in | Wt 142.4 lb

## 2017-05-01 DIAGNOSIS — Z0000 Encounter for general adult medical examination without abnormal findings: Secondary | ICD-10-CM | POA: Diagnosis not present

## 2017-05-01 DIAGNOSIS — N912 Amenorrhea, unspecified: Secondary | ICD-10-CM | POA: Diagnosis not present

## 2017-05-01 DIAGNOSIS — M6789 Other specified disorders of synovium and tendon, multiple sites: Secondary | ICD-10-CM

## 2017-05-01 DIAGNOSIS — M7139 Other bursal cyst, multiple sites: Secondary | ICD-10-CM

## 2017-05-01 DIAGNOSIS — M6749 Ganglion, multiple sites: Secondary | ICD-10-CM | POA: Insufficient documentation

## 2017-05-01 NOTE — Progress Notes (Signed)
BP 133/79 (BP Location: Left Arm, Patient Position: Sitting, Cuff Size: Normal)   Pulse 99   Temp 98.6 F (37 C)   Ht 5' 0.3" (1.532 m)   Wt 142 lb 6 oz (64.6 kg)   LMP  (LMP Unknown)   SpO2 99%   BMI 27.53 kg/m    Subjective:    Patient ID: Marissa Reynolds, female    DOB: Jan 16, 1966, 51 y.o.   MRN: [id number]  HPI: Marissa Reynolds is a 51 y.o. female presenting on 05/01/2017 for comprehensive medical examination. Current medical complaints include:none  She currently lives with: Alinda Moneyony, husband Menopausal Symptoms: no  Depression Screen done today and results listed below:  [facility] 05/01/2017 10/19/2015 09/07/2015 09/07/2015  Decreased Interest 0 0 2 2  Down, Depressed, Hopeless 0 0 2 2  PHQ - 2 Score 0 0 4 4  Altered sleeping - 2 3 3   Tired, decreased energy - 0 3 3  Change in appetite - 0 3 3  Feeling bad or failure about yourself  - 0 1 1  Trouble concentrating - 0 1 1  Moving slowly or fidgety/restless - 0 1 1  Suicidal thoughts - 0 0 0  PHQ-9 Score - 2 16 16   Difficult doing work/chores - Somewhat difficult Somewhat difficult Somewhat difficult    Past Medical History:  Past Medical History:  Diagnosis Date  . Asthma     Surgical History:  Past Surgical History:  Procedure Laterality Date  . CESAREAN SECTION      Medications:  Current Outpatient Prescriptions on File Prior to Visit  Medication Sig  . albuterol (PROVENTIL HFA;VENTOLIN HFA) 108 (90 BASE) MCG/ACT inhaler Inhale 2 puffs into the lungs every 6 (six) hours as needed for wheezing or shortness of breath.  . beclomethasone (QVAR) 80 MCG/ACT inhaler Inhale 1 puff into the lungs 2 (two) times daily.   No current facility-administered medications on file prior to visit.     Allergies:  No Known Allergies  Social History:  Social History   Social History  . Marital status: Married    Spouse name: N/A  . Number of children: N/A  . Years of education: N/A   Occupational History    . Not on file.   Social History Main Topics  . Smoking status: Never Smoker  . Smokeless tobacco: Never Used  . Alcohol use No  . Drug use: No  . Sexual activity: Yes    Birth control/ protection: Post-menopausal   Other Topics Concern  . Not on file   Social History Narrative  . No narrative on file   History  Smoking Status  . Never Smoker  Smokeless Tobacco  . Never Used   History  Alcohol Use No    Family History:  Family History  Problem Relation Age of Onset  . Anemia Mother        RAEB-1  . Heart attack Father   . Kidney disease Father   . Stroke Maternal Grandmother   . Diabetes Maternal Grandfather   . Breast cancer Cousin     Past medical history, surgical history, medications, allergies, family history and social history reviewed with patient today and changes made to appropriate areas of the chart.   Review of Systems  Constitutional: Negative.   HENT: Negative.   Eyes: Negative.   Respiratory: Negative.   Cardiovascular: Negative.   Gastrointestinal: Negative.   Genitourinary: Negative.   Musculoskeletal: Negative.   Skin: Negative.   Neurological:  Negative.   Endo/Heme/Allergies: Negative.   Psychiatric/Behavioral: Negative.     All other ROS negative except what is listed above and in the HPI.      Objective:    BP 133/79 (BP Location: Left Arm, Patient Position: Sitting, Cuff Size: Normal)   Pulse 99   Temp 98.6 F (37 C)   Ht 5' 0.3" (1.532 m)   Wt 142 lb 6 oz (64.6 kg)   LMP  (LMP Unknown)   SpO2 99%   BMI 27.53 kg/m   Wt Readings from Last 3 Encounters:  05/01/17 142 lb 6 oz (64.6 kg)  01/25/16 204 lb (92.5 kg)  10/19/15 207 lb (93.9 kg)    Physical Exam  Constitutional: She is oriented to person, place, and time. She appears well-developed and well-nourished. No distress.  HENT:  Head: Normocephalic and atraumatic.  Right Ear: Hearing and external ear normal.  Left Ear: Hearing and external ear normal.  Nose: Nose  normal.  Mouth/Throat: Oropharynx is clear and moist. No oropharyngeal exudate.  Eyes: Pupils are equal, round, and reactive to light. Conjunctivae, EOM and lids are normal. Right eye exhibits no discharge. Left eye exhibits no discharge. No scleral icterus.  Neck: Normal range of motion. Neck supple. No JVD present. No tracheal deviation present. No thyromegaly present.  Cardiovascular: Normal rate, regular rhythm, normal heart sounds and intact distal pulses.  Exam reveals no gallop and no friction rub.   No murmur heard. Pulmonary/Chest: Effort normal and breath sounds normal. No stridor. No respiratory distress. She has no wheezes. She has no rales. She exhibits no tenderness. Right breast exhibits no inverted nipple, no mass, no nipple discharge, no skin change and no tenderness. Left breast exhibits no inverted nipple, no mass, no nipple discharge, no skin change and no tenderness. Breasts are symmetrical.  Abdominal: Soft. Bowel sounds are normal. She exhibits no distension and no mass. There is no tenderness. There is no rebound and no guarding.  Genitourinary:  Genitourinary Comments: Exam deferred with shared decision making.   Musculoskeletal: Normal range of motion. She exhibits no edema, tenderness or deformity.  Lymphadenopathy:    She has no cervical adenopathy.  Neurological: She is alert and oriented to person, place, and time. She has normal reflexes. She displays normal reflexes. No cranial nerve deficit. She exhibits normal muscle tone. Coordination normal.  Skin: Skin is warm, dry and intact. No rash noted. She is not diaphoretic. No erythema. No pallor.  Psychiatric: She has a normal mood and affect. Her speech is normal and behavior is normal. Judgment and thought content normal. Cognition and memory are normal.  Nursing note and vitals reviewed.   Results for orders placed or performed in visit on 06/01/16  Cologuard  Result Value Ref Range   Cologuard Negative         Assessment & Plan:   Problem List Items Addressed This Visit    None    Visit Diagnoses    Routine general medical examination at a health care facility    -  Primary   Vaccines up to date. Screening labs checked today. Pap up to date. Cologuard up to date. Mammogram up to date. Continue diet and exercise. Call with any concern   Relevant Orders   CBC with Differential/Platelet   Comprehensive metabolic panel   Lipid Panel w/o Chol/HDL Ratio   TSH   UA/M w/rflx Culture, Routine   Amenorrhea       Likely post-menopausal. Checking levels today.  Relevant Orders   LH   FSH   Estradiol       Follow up plan: Return in about 1 year (around 05/01/2018) for Physical.   LABORATORY TESTING:  - Pap smear: up to date  IMMUNIZATIONS:   - Tdap: Tetanus vaccination status reviewed: last tetanus booster within 10 years. - Influenza: Postponed to flu season - Pneumovax: Not applicable - Prevnar: Not applicable - Zostavax vaccine: Will check with her insurance  SCREENING: -Mammogram: Up to date  - Colonoscopy: Up to date  - Bone Density: Not applicable   PATIENT COUNSELING:   Advised to take 1 mg of folate supplement per day if capable of pregnancy.   Sexuality: Discussed sexually transmitted diseases, partner selection, use of condoms, avoidance of unintended pregnancy  and contraceptive alternatives.   Advised to avoid cigarette smoking.  I discussed with the patient that most people either abstain from alcohol or drink within safe limits (<=14/week and <=4 drinks/occasion for males, <=7/weeks and <= 3 drinks/occasion for females) and that the risk for alcohol disorders and other health effects rises proportionally with the number of drinks per week and how often a drinker exceeds daily limits.  Discussed cessation/primary prevention of drug use and availability of treatment for abuse.   Diet: Encouraged to adjust caloric intake to maintain  or achieve ideal body weight, to  reduce intake of dietary saturated fat and total fat, to limit sodium intake by avoiding high sodium foods and not adding table salt, and to maintain adequate dietary potassium and calcium preferably from fresh fruits, vegetables, and low-fat dairy products.    stressed the importance of regular exercise  Injury prevention: Discussed safety belts, safety helmets, smoke detector, smoking near bedding or upholstery.   Dental health: Discussed importance of regular tooth brushing, flossing, and dental visits.    NEXT PREVENTATIVE PHYSICAL DUE IN 1 YEAR. Return in about 1 year (around 05/01/2018) for Physical.

## 2017-05-02 LAB — LIPID PANEL W/O CHOL/HDL RATIO
Cholesterol, Total: 240 mg/dL — ABNORMAL HIGH (ref 100–199)
HDL: 52 mg/dL (ref >39)
LDL CALC: 167 mg/dL — AB (ref 0–99)
TRIGLYCERIDES: 103 mg/dL (ref 0–149)
VLDL CHOLESTEROL CAL: 21 mg/dL (ref 5–40)

## 2017-05-02 LAB — CBC WITH DIFFERENTIAL/PLATELET
Basophils Absolute: 0 10*3/uL (ref 0.0–0.2)
Basos: 0 %
EOS (ABSOLUTE): 0.2 10*3/uL (ref 0.0–0.4)
EOS: 3 %
Hematocrit: 43.6 % (ref 34.0–46.6)
Hemoglobin: 13.9 g/dL (ref 11.1–15.9)
IMMATURE GRANS (ABS): 0 10*3/uL (ref 0.0–0.1)
IMMATURE GRANULOCYTES: 0 %
LYMPHS: 27 %
Lymphocytes Absolute: 1.9 10*3/uL (ref 0.7–3.1)
MCH: 28.7 pg (ref 26.6–33.0)
MCHC: 31.9 g/dL (ref 31.5–35.7)
MCV: 90 fL (ref 79–97)
MONOS ABS: 0.4 10*3/uL (ref 0.1–0.9)
Monocytes: 6 %
NEUTROS PCT: 64 %
Neutrophils Absolute: 4.4 10*3/uL (ref 1.4–7.0)
PLATELETS: 262 10*3/uL (ref 150–379)
RBC: 4.85 x10E6/uL (ref 3.77–5.28)
RDW: 14.9 % (ref 12.3–15.4)
WBC: 6.8 10*3/uL (ref 3.4–10.8)

## 2017-05-02 LAB — FOLLICLE STIMULATING HORMONE: FSH: 33 m[IU]/mL

## 2017-05-02 LAB — COMPREHENSIVE METABOLIC PANEL
ALT: 13 IU/L (ref 0–32)
AST: 13 IU/L (ref 0–40)
Albumin/Globulin Ratio: 1.5 (ref 1.2–2.2)
Albumin: 4.3 g/dL (ref 3.5–5.5)
Alkaline Phosphatase: 52 IU/L (ref 39–117)
BUN/Creatinine Ratio: 27 — ABNORMAL HIGH (ref 9–23)
BUN: 15 mg/dL (ref 6–24)
Bilirubin Total: 0.6 mg/dL (ref 0.0–1.2)
CALCIUM: 9.6 mg/dL (ref 8.7–10.2)
CO2: 22 mmol/L (ref 20–29)
Chloride: 98 mmol/L (ref 96–106)
Creatinine, Ser: 0.55 mg/dL — ABNORMAL LOW (ref 0.57–1.00)
GFR calc Af Amer: 125 mL/min/{1.73_m2} (ref >59)
GFR, EST NON AFRICAN AMERICAN: 108 mL/min/{1.73_m2} (ref >59)
GLUCOSE: 78 mg/dL (ref 65–99)
Globulin, Total: 2.9 g/dL (ref 1.5–4.5)
POTASSIUM: 4 mmol/L (ref 3.5–5.2)
Sodium: 140 mmol/L (ref 134–144)
TOTAL PROTEIN: 7.2 g/dL (ref 6.0–8.5)

## 2017-05-02 LAB — ESTRADIOL: Estradiol: 81.3 pg/mL

## 2017-05-02 LAB — LUTEINIZING HORMONE: LH: 23.2 m[IU]/mL

## 2017-05-02 LAB — TSH: TSH: 1.72 u[IU]/mL (ref 0.450–4.500)

## 2017-05-10 LAB — MICROSCOPIC EXAMINATION
Bacteria, UA: NONE SEEN
RBC, UA: NONE SEEN /hpf (ref 0–?)
RENAL EPITHEL UA: NONE SEEN /HPF

## 2017-05-10 LAB — UA/M W/RFLX CULTURE, ROUTINE
Bilirubin, UA: NEGATIVE
GLUCOSE, UA: NEGATIVE
LEUKOCYTES UA: NEGATIVE
NITRITE UA: NEGATIVE
Protein, UA: NEGATIVE
RBC, UA: NEGATIVE
Specific Gravity, UA: 1.015 (ref 1.005–1.030)
Urobilinogen, Ur: 0.2 mg/dL (ref 0.2–1.0)
pH, UA: 5.5 (ref 5.0–7.5)

## 2017-07-17 ENCOUNTER — Ambulatory Visit (INDEPENDENT_AMBULATORY_CARE_PROVIDER_SITE_OTHER): Payer: BLUE CROSS/BLUE SHIELD | Admitting: Family Medicine

## 2017-07-17 ENCOUNTER — Encounter: Payer: Self-pay | Admitting: Family Medicine

## 2017-07-17 VITALS — BP 124/79 | HR 71 | Temp 98.8°F | Wt 134.5 lb

## 2017-07-17 DIAGNOSIS — R490 Dysphonia: Secondary | ICD-10-CM | POA: Diagnosis not present

## 2017-07-17 DIAGNOSIS — N939 Abnormal uterine and vaginal bleeding, unspecified: Secondary | ICD-10-CM

## 2017-07-17 DIAGNOSIS — L659 Nonscarring hair loss, unspecified: Secondary | ICD-10-CM | POA: Diagnosis not present

## 2017-07-17 NOTE — Progress Notes (Signed)
BP 124/79 (BP Location: Left Arm, Patient Position: Sitting, Cuff Size: Normal)   Pulse 71   Temp 98.8 F (37.1 C)   Wt 134 lb 8 oz (61 kg)   LMP  (LMP Unknown)   SpO2 100%   BMI 26.01 kg/m    Subjective:    Patient ID: Marissa Reynolds, female    DOB: 1966/05/15, 51 y.o.   MRN: [id number]  HPI: Marissa Reynolds is a 51 y.o. female  Chief Complaint  Patient presents with  . Hot Flashes  . Alopecia  . Hoarse    as the day goes on her voice gets worse  . Menstrual Problem    Had a period in August and has not had one for 1.5 years  . Other    Patient would like to be mailed a letter with her lab results.    Has been having some issues with her hair falling out. Seems to be coming out all over the head, Not in one particular spot.   ABNORMAL MENSTRUAL PERIODS- had another period in August without having a period in 1.5 years, more light, but not really spotting, 3-5 days. No cramps Duration: once Average interval between menses: over 1.5 years Length of menses: 3-5 days Flow: light Dysmenorrhea: no Intermenstrual bleeding:no Postcoital bleeding: no Sexual activity: In a Monogamous Relationship History of sexually transmitted diseases: no History GYN procedures: no Abnormal pap smears: no   Dyspareunia: no Vaginal discharge:no Abdominal pain: no Galactorrhea: no Hirsuitism: no Frequent bruising/mucosal bleeding: no Double vision:no Hot flashes: yes   MENOPAUSAL SYMPTOMS Duration: about a month Symptom severity: moderate Hot flashes: yes Night sweats: no Sleep disturbances: no Vaginal dryness: no Dyspareunia:no Decreased libido: no Emotional lability: no Stress incontinence: no Previous HRT/pharmacotherapy: no Hysterectomy: no  Has been noticing a bit of hoarseness at the end of the day. No cold symptoms. Otherwise feeling well, seems to have resolved this week, but was happening last week.   Relevant past medical, surgical, family and social history reviewed  and updated as indicated. Interim medical history since our last visit reviewed. Allergies and medications reviewed and updated.  Review of Systems  Constitutional: Negative.   HENT: Positive for voice change. Negative for congestion, dental problem, drooling, ear discharge, ear pain, facial swelling, hearing loss, mouth sores, nosebleeds, postnasal drip, rhinorrhea, sinus pain, sinus pressure, sneezing, sore throat, tinnitus and trouble swallowing.   Respiratory: Negative.   Cardiovascular: Negative.   Endocrine: Positive for cold intolerance and heat intolerance. Negative for polydipsia, polyphagia and polyuria.  Skin: Negative.   Hematological: Negative.   Psychiatric/Behavioral: Negative.     Per HPI unless specifically indicated above     Objective:    BP 124/79 (BP Location: Left Arm, Patient Position: Sitting, Cuff Size: Normal)   Pulse 71   Temp 98.8 F (37.1 C)   Wt 134 lb 8 oz (61 kg)   LMP  (LMP Unknown)   SpO2 100%   BMI 26.01 kg/m   Wt Readings from Last 3 Encounters:  07/17/17 134 lb 8 oz (61 kg)  05/01/17 142 lb 6 oz (64.6 kg)  01/25/16 204 lb (92.5 kg)    Physical Exam  Constitutional: She is oriented to person, place, and time. She appears well-developed and well-nourished. No distress.  HENT:  Head: Normocephalic and atraumatic.  Right Ear: Hearing normal.  Left Ear: Hearing normal.  Nose: Nose normal.  Eyes: Conjunctivae and lids are normal. Right eye exhibits no discharge. Left eye exhibits no discharge.  No scleral icterus.  Cardiovascular: Normal rate, regular rhythm, normal heart sounds and intact distal pulses.  Exam reveals no gallop and no friction rub.   No murmur heard. Pulmonary/Chest: Effort normal and breath sounds normal. No respiratory distress. She has no wheezes. She has no rales. She exhibits no tenderness.  Musculoskeletal: Normal range of motion.  Neurological: She is alert and oriented to person, place, and time.  Skin: Skin is warm,  dry and intact. No rash noted. She is not diaphoretic. No erythema. No pallor.  Psychiatric: She has a normal mood and affect. Her speech is normal and behavior is normal. Judgment and thought content normal. Cognition and memory are normal.  Nursing note and vitals reviewed.   Results for orders placed or performed in visit on 05/01/17  Microscopic Examination  Result Value Ref Range   WBC, UA 0-5 0 - 5 /hpf   RBC, UA None seen 0 - 2 /hpf   Epithelial Cells (non renal) 0-10 0 - 10 /hpf   Renal Epithel, UA None seen None seen /hpf   Bacteria, UA None seen None seen/Few  CBC with Differential/Platelet  Result Value Ref Range   WBC 6.8 3.4 - 10.8 x10E3/uL   RBC 4.85 3.77 - 5.28 x10E6/uL   Hemoglobin 13.9 11.1 - 15.9 g/dL   Hematocrit 16.1 09.6 - 46.6 %   MCV 90 79 - 97 fL   MCH 28.7 26.6 - 33.0 pg   MCHC 31.9 31.5 - 35.7 g/dL   RDW 04.5 40.9 - 81.1 %   Platelets 262 150 - 379 x10E3/uL   Neutrophils 64 Not Estab. %   Lymphs 27 Not Estab. %   Monocytes 6 Not Estab. %   Eos 3 Not Estab. %   Basos 0 Not Estab. %   Neutrophils Absolute 4.4 1.4 - 7.0 x10E3/uL   Lymphocytes Absolute 1.9 0.7 - 3.1 x10E3/uL   Monocytes Absolute 0.4 0.1 - 0.9 x10E3/uL   EOS (ABSOLUTE) 0.2 0.0 - 0.4 x10E3/uL   Basophils Absolute 0.0 0.0 - 0.2 x10E3/uL   Immature Granulocytes 0 Not Estab. %   Immature Grans (Abs) 0.0 0.0 - 0.1 x10E3/uL  Comprehensive metabolic panel  Result Value Ref Range   Glucose 78 65 - 99 mg/dL   BUN 15 6 - 24 mg/dL   Creatinine, Ser 9.14 (L) 0.57 - 1.00 mg/dL   GFR calc non Af Amer 108 >59 mL/min/1.73   GFR calc Af Amer 125 >59 mL/min/1.73   BUN/Creatinine Ratio 27 (H) 9 - 23   Sodium 140 134 - 144 mmol/L   Potassium 4.0 3.5 - 5.2 mmol/L   Chloride 98 96 - 106 mmol/L   CO2 22 20 - 29 mmol/L   Calcium 9.6 8.7 - 10.2 mg/dL   Total Protein 7.2 6.0 - 8.5 g/dL   Albumin 4.3 3.5 - 5.5 g/dL   Globulin, Total 2.9 1.5 - 4.5 g/dL   Albumin/Globulin Ratio 1.5 1.2 - 2.2   Bilirubin  Total 0.6 0.0 - 1.2 mg/dL   Alkaline Phosphatase 52 39 - 117 IU/L   AST 13 0 - 40 IU/L   ALT 13 0 - 32 IU/L  Lipid Panel w/o Chol/HDL Ratio  Result Value Ref Range   Cholesterol, Total 240 (H) 100 - 199 mg/dL   Triglycerides 782 0 - 149 mg/dL   HDL 52 >95 mg/dL   VLDL Cholesterol Cal 21 5 - 40 mg/dL   LDL Calculated 621 (H) 0 - 99 mg/dL  TSH  Result Value Ref Range   TSH 1.720 0.450 - 4.500 uIU/mL  UA/M w/rflx Culture, Routine  Result Value Ref Range   Specific Gravity, UA 1.015 1.005 - 1.030   pH, UA 5.5 5.0 - 7.5   Color, UA Yellow Yellow   Appearance Ur Clear Clear   Leukocytes, UA Negative Negative   Protein, UA Negative Negative/Trace   Glucose, UA Negative Negative   Ketones, UA 3+ (A) Negative   RBC, UA Negative Negative   Bilirubin, UA Negative Negative   Urobilinogen, Ur 0.2 0.2 - 1.0 mg/dL   Nitrite, UA Negative Negative   Microscopic Examination See below:   LH  Result Value Ref Range   LH 23.2 mIU/mL  FSH  Result Value Ref Range   FSH 33.0 mIU/mL  Estradiol  Result Value Ref Range   Estradiol 81.3 pg/mL      Assessment & Plan:   Problem List Items Addressed This Visit    None    Visit Diagnoses    Hair loss    -  Primary   Possibly stress related. Will check labs. Await results. Continue to monitor. Call with any concerns.    Relevant Orders   Thyroid Panel With TSH   Testosterone, free, total   Estradiol   FSH   LH   CBC with Differential/Platelet   Abnormal uterine bleeding       No menses for 1.5 years, then menses. Will check labs, not definitive last visit. Await results, if not clear reason for symptoms. Will send to GYN for ?EB   Relevant Orders   Thyroid Panel With TSH   Testosterone, free, total   Estradiol   FSH   LH   CBC with Differential/Platelet   Hoarseness       Likely allergic. Gone now, if returns, try flonase, if not better, will refer to ENT.       Follow up plan: Return Pending results.

## 2017-07-18 LAB — CBC WITH DIFFERENTIAL/PLATELET
BASOS: 1 %
Basophils Absolute: 0.1 10*3/uL (ref 0.0–0.2)
EOS (ABSOLUTE): 0.2 10*3/uL (ref 0.0–0.4)
EOS: 2 %
Hematocrit: 40.5 % (ref 34.0–46.6)
Hemoglobin: 13.7 g/dL (ref 11.1–15.9)
IMMATURE GRANS (ABS): 0 10*3/uL (ref 0.0–0.1)
IMMATURE GRANULOCYTES: 0 %
Lymphocytes Absolute: 2.9 10*3/uL (ref 0.7–3.1)
Lymphs: 40 %
MCH: 30 pg (ref 26.6–33.0)
MCHC: 33.8 g/dL (ref 31.5–35.7)
MCV: 89 fL (ref 79–97)
MONOCYTES: 7 %
Monocytes Absolute: 0.5 10*3/uL (ref 0.1–0.9)
NEUTROS PCT: 50 %
Neutrophils Absolute: 3.6 10*3/uL (ref 1.4–7.0)
Platelets: 272 10*3/uL (ref 150–379)
RBC: 4.56 x10E6/uL (ref 3.77–5.28)
RDW: 13.8 % (ref 12.3–15.4)
WBC: 7.2 10*3/uL (ref 3.4–10.8)

## 2017-07-18 LAB — THYROID PANEL WITH TSH
FREE THYROXINE INDEX: 1.8 (ref 1.2–4.9)
T3 UPTAKE RATIO: 30 % (ref 24–39)
T4 TOTAL: 6.1 ug/dL (ref 4.5–12.0)
TSH: 1.41 u[IU]/mL (ref 0.450–4.500)

## 2017-07-18 LAB — TESTOSTERONE, FREE, TOTAL, SHBG
Sex Hormone Binding: 56.8 nmol/L (ref 17.3–125.0)
Testosterone, Free: 0.6 pg/mL (ref 0.0–4.2)

## 2017-07-18 LAB — FOLLICLE STIMULATING HORMONE: FSH: 51.3 m[IU]/mL

## 2017-07-18 LAB — LUTEINIZING HORMONE: LH: 29.3 m[IU]/mL

## 2017-07-18 LAB — ESTRADIOL: Estradiol: 5.6 pg/mL

## 2017-07-20 ENCOUNTER — Telehealth: Payer: Self-pay | Admitting: Family Medicine

## 2017-07-20 ENCOUNTER — Encounter: Payer: Self-pay | Admitting: Family Medicine

## 2017-07-20 DIAGNOSIS — N950 Postmenopausal bleeding: Secondary | ICD-10-CM

## 2017-07-20 NOTE — Telephone Encounter (Signed)
Called patient with her results. Levels now in post-menopausal range. Given bleeding, will refer to GYN for evaluation and ?endometrial biopsy per their discretion. Referral generated. Patient aware.

## 2017-10-01 ENCOUNTER — Encounter: Payer: BLUE CROSS/BLUE SHIELD | Admitting: Certified Nurse Midwife

## 2017-10-23 ENCOUNTER — Ambulatory Visit (INDEPENDENT_AMBULATORY_CARE_PROVIDER_SITE_OTHER): Payer: BLUE CROSS/BLUE SHIELD | Admitting: Certified Nurse Midwife

## 2017-10-23 ENCOUNTER — Encounter: Payer: Self-pay | Admitting: Certified Nurse Midwife

## 2017-10-23 ENCOUNTER — Other Ambulatory Visit (INDEPENDENT_AMBULATORY_CARE_PROVIDER_SITE_OTHER): Payer: BLUE CROSS/BLUE SHIELD

## 2017-10-23 VITALS — BP 137/73 | HR 76 | Wt 165.5 lb

## 2017-10-23 DIAGNOSIS — N939 Abnormal uterine and vaginal bleeding, unspecified: Secondary | ICD-10-CM | POA: Diagnosis not present

## 2017-10-23 NOTE — Patient Instructions (Addendum)
Preventive Care 40-64 Years, Female Preventive care refers to lifestyle choices and visits with your health care provider that can promote health and wellness. What does preventive care include?  A yearly physical exam. This is also called an annual well check.  Dental exams once or twice a year.  Routine eye exams. Ask your health care provider how often you should have your eyes checked.  Personal lifestyle choices, including: ? Daily care of your teeth and gums. ? Regular physical activity. ? Eating a healthy diet. ? Avoiding tobacco and drug use. ? Limiting alcohol use. ? Practicing safe sex. ? Taking low-dose aspirin daily starting at age 58. ? Taking vitamin and mineral supplements as recommended by your health care provider. What happens during an annual well check? The services and screenings done by your health care provider during your annual well check will depend on your age, overall health, lifestyle risk factors, and family history of disease. Counseling Your health care provider may ask you questions about your:  Alcohol use.  Tobacco use.  Drug use.  Emotional well-being.  Home and relationship well-being.  Sexual activity.  Eating habits.  Work and work Statistician.  Method of birth control.  Menstrual cycle.  Pregnancy history.  Screening You may have the following tests or measurements:  Height, weight, and BMI.  Blood pressure.  Lipid and cholesterol levels. These may be checked every 5 years, or more frequently if you are over 81 years old.  Skin check.  Lung cancer screening. You may have this screening every year starting at age 78 if you have a 30-pack-year history of smoking and currently smoke or have quit within the past 15 years.  Fecal occult blood test (FOBT) of the stool. You may have this test every year starting at age 65.  Flexible sigmoidoscopy or colonoscopy. You may have a sigmoidoscopy every 5 years or a colonoscopy  every 10 years starting at age 30.  Hepatitis C blood test.  Hepatitis B blood test.  Sexually transmitted disease (STD) testing.  Diabetes screening. This is done by checking your blood sugar (glucose) after you have not eaten for a while (fasting). You may have this done every 1-3 years.  Mammogram. This may be done every 1-2 years. Talk to your health care provider about when you should start having regular mammograms. This may depend on whether you have a family history of breast cancer.  BRCA-related cancer screening. This may be done if you have a family history of breast, ovarian, tubal, or peritoneal cancers.  Pelvic exam and Pap test. This may be done every 3 years starting at age 80. Starting at age 36, this may be done every 5 years if you have a Pap test in combination with an HPV test.  Bone density scan. This is done to screen for osteoporosis. You may have this scan if you are at high risk for osteoporosis.  Discuss your test results, treatment options, and if necessary, the need for more tests with your health care provider. Vaccines Your health care provider may recommend certain vaccines, such as:  Influenza vaccine. This is recommended every year.  Tetanus, diphtheria, and acellular pertussis (Tdap, Td) vaccine. You may need a Td booster every 10 years.  Varicella vaccine. You may need this if you have not been vaccinated.  Zoster vaccine. You may need this after age 5.  Measles, mumps, and rubella (MMR) vaccine. You may need at least one dose of MMR if you were born in  1957 or later. You may also need a second dose.  Pneumococcal 13-valent conjugate (PCV13) vaccine. You may need this if you have certain conditions and were not previously vaccinated.  Pneumococcal polysaccharide (PPSV23) vaccine. You may need one or two doses if you smoke cigarettes or if you have certain conditions.  Meningococcal vaccine. You may need this if you have certain  conditions.  Hepatitis A vaccine. You may need this if you have certain conditions or if you travel or work in places where you may be exposed to hepatitis A.  Hepatitis B vaccine. You may need this if you have certain conditions or if you travel or work in places where you may be exposed to hepatitis B.  Haemophilus influenzae type b (Hib) vaccine. You may need this if you have certain conditions.  Talk to your health care provider about which screenings and vaccines you need and how often you need them. This information is not intended to replace advice given to you by your health care provider. Make sure you discuss any questions you have with your health care provider. Document Released: 10/29/2015 Document Revised: 06/21/2016 Document Reviewed: 08/03/2015 Elsevier Interactive Patient Education  2018 [employer].  Postmenopausal Bleeding Postmenopausal bleeding is any bleeding after menopause. Menopause is when a woman's period stops. Any type of bleeding after menopause is concerning. It should be checked by your doctor. Any treatment will depend on the cause. Follow these instructions at home: Watch your condition for any changes.  Avoid the use of tampons and douches as told by your doctor.  Change your pads often.  Get regular pelvic exams and Pap tests.  Keep all appointments for tests as told by your doctor.  Contact a doctor if:  Your bleeding lasts for more than 1 week.  You have belly (abdominal) pain.  You have bleeding after sex (intercourse). Get help right away if:  You have a fever, chills, a headache, dizziness, muscle aches, and bleeding.  You have strong pain with bleeding.  You have clumps of blood (blood clots) coming from your vagina.  You have bleeding and need more than 1 pad an hour.  You feel like you are going to pass out (faint). This information is not intended to replace advice given to you by your health care provider. Make sure you  discuss any questions you have with your health care provider. Document Released: 07/11/2008 Document Revised: 03/09/2016 Document Reviewed: 05/01/2013 Elsevier Interactive Patient Education  2017 [employer].

## 2017-10-23 NOTE — Progress Notes (Signed)
Patient reports having a period 5 months ago that lasted a few days. She has not had one in over 1 year, and has not had one since that 1 episode in August.

## 2017-10-23 NOTE — Progress Notes (Signed)
GYN ENCOUNTER NOTE  Subjective:       Marissa Reynolds is a 52 y.o. No obstetric history on file. female is here for gynecologic evaluation of the following issues:  1. Post menopausal bleeding. Pt states she has been menopausal for a 1 1/2 years then had vaginal bleeding x 1 this past August. She was referred by her primary care provider after being seen for her annual physical. She states the bleeding was like a period lasting 2-3 days and that she had to use a tampon. She denies any pelvic pain or tenderness. She denies any trauma or recent surgery to the surrounding area during that time. She was under some stress as her spouse was having surgery.    Gynecologic History No LMP recorded (lmp unknown). Patient is postmenopausal. Contraception: none Last Pap: 2015 Results were: normal Last mammogram: 01/16/2017. Results were: normal  Obstetric History OB History  No data available    Past Medical History:  Diagnosis Date  . Asthma     Past Surgical History:  Procedure Laterality Date  . CESAREAN SECTION      Current Outpatient Medications on File Prior to Visit  Medication Sig Dispense Refill  . albuterol (PROVENTIL HFA;VENTOLIN HFA) 108 (90 BASE) MCG/ACT inhaler Inhale 2 puffs into the lungs every 6 (six) hours as needed for wheezing or shortness of breath.    . beclomethasone (QVAR) 80 MCG/ACT inhaler Inhale 1 puff into the lungs 2 (two) times daily.    . Biotin 1000 MCG tablet Take 1,000 mcg by mouth 3 (three) times daily.    . Multiple Vitamin (MULTIVITAMIN) tablet Take 1 tablet by mouth daily.    Marland Kitchen. OVER THE COUNTER MEDICATION Vitamin B 6    . Potassium 75 MG TABS Take by mouth.     No current facility-administered medications on file prior to visit.     No Known Allergies  Social History   Socioeconomic History  . Marital status: Married    Spouse name: Not on file  . Number of children: Not on file  . Years of education: Not on file  . Highest education level: Not on  file  Social Needs  . Financial resource strain: Not on file  . Food insecurity - worry: Not on file  . Food insecurity - inability: Not on file  . Transportation needs - medical: Not on file  . Transportation needs - non-medical: Not on file  Occupational History  . Not on file  Tobacco Use  . Smoking status: Never Smoker  . Smokeless tobacco: Never Used  Substance and Sexual Activity  . Alcohol use: No  . Drug use: No  . Sexual activity: Yes    Birth control/protection: Post-menopausal  Other Topics Concern  . Not on file  Social History Narrative  . Not on file    Family History  Problem Relation Age of Onset  . Anemia Mother        RAEB-1  . Other Mother        RAEB 1  . Heart attack Father   . Kidney disease Father   . Stroke Maternal Grandmother   . Diabetes Maternal Grandfather   . Breast cancer Cousin     The following portions of the patient's history were reviewed and updated as appropriate: allergies, current medications, past family history, past medical history, past social history, past surgical history and problem list.  Review of Systems Review of Systems - Negative except as mentioned in HPI Review of  Systems - General ROS: negative for - chills, fatigue, fever, hot flashes, malaise or night sweats Hematological and Lymphatic ROS: negative for - bleeding problems or swollen lymph nodes Gastrointestinal ROS: negative for - abdominal pain, blood in stools, change in bowel habits and nausea/vomiting Musculoskeletal ROS: negative for - joint pain, muscle pain or muscular weakness Genito-Urinary ROS: negative for - dysmenorrhea, dyspareunia, dysuria, genital discharge, genital ulcers, hematuria, incontinence, nocturia or pelvic pain. Positive for post menopausal bleeding.   Objective:   BP 137/73   Pulse 76   Wt 165 lb 8 oz (75.1 kg)   LMP  (LMP Unknown)   BMI 32.00 kg/m  CONSTITUTIONAL: Well-developed, well-nourished female in no acute distress.   HENT:  Normocephalic, atraumatic.  NECK: Normal range of motion, supple SKIN: Skin is warm and dry. No rash noted. Not diaphoretic. No erythema. No pallor. NEUROLGIC: Alert and oriented to person, place, and time.  PSYCHIATRIC: Normal mood and affect. Normal behavior. Normal judgment and thought content. CARDIOVASCULAR:RRR RESPIRATORY: Clear bilaterally  BREASTS: Not Examined ABDOMEN: Soft, non distended; Non tender.  No Organomegaly. PELVIC:  External Genitalia: Normal  BUS: Normal  Vagina: Normal  Cervix: Normal  Uterus: Normal size, shape,consistency, mobile, no tenderness  Adnexa: Normal  RV: Normal   Bladder: Nontender MUSCULOSKELETAL: Normal range of motion. No tenderness.  No cyanosis, clubbing, or edema.   Assessment:   Post menopausal bleeding - US PELVIS (TRANSABDOMINAL ONLY); Future - Pap IG w/ reflex to HPV when ASC-U     Plan:   Will follow up with results of pap smear and ultrasound. Return PRN of is symptoms get worse.   Doreene Burke, CNM

## 2017-10-24 LAB — PAP IG W/ RFLX HPV ASCU: PAP Smear Comment: 0

## 2017-10-26 ENCOUNTER — Encounter: Payer: Self-pay | Admitting: Certified Nurse Midwife

## 2017-10-29 ENCOUNTER — Other Ambulatory Visit: Payer: Self-pay | Admitting: Certified Nurse Midwife

## 2017-10-29 DIAGNOSIS — N950 Postmenopausal bleeding: Secondary | ICD-10-CM

## 2017-10-29 NOTE — Progress Notes (Signed)
Repeat ultrasound for post menopausal bleed .   Doreene BurkeAnnie Seymour Pavlak, CNM

## 2018-12-17 ENCOUNTER — Encounter: Payer: Self-pay | Admitting: Family Medicine

## 2018-12-17 ENCOUNTER — Ambulatory Visit (INDEPENDENT_AMBULATORY_CARE_PROVIDER_SITE_OTHER): Payer: Self-pay | Admitting: Family Medicine

## 2018-12-17 VITALS — BP 140/86 | HR 87 | Ht 61.0 in | Wt 204.5 lb

## 2018-12-17 DIAGNOSIS — Z0000 Encounter for general adult medical examination without abnormal findings: Secondary | ICD-10-CM

## 2018-12-17 DIAGNOSIS — Z1239 Encounter for other screening for malignant neoplasm of breast: Secondary | ICD-10-CM

## 2018-12-17 DIAGNOSIS — G4719 Other hypersomnia: Secondary | ICD-10-CM

## 2018-12-17 DIAGNOSIS — M1990 Unspecified osteoarthritis, unspecified site: Secondary | ICD-10-CM | POA: Insufficient documentation

## 2018-12-17 DIAGNOSIS — E782 Mixed hyperlipidemia: Secondary | ICD-10-CM

## 2018-12-17 DIAGNOSIS — R0683 Snoring: Secondary | ICD-10-CM

## 2018-12-17 DIAGNOSIS — Z1211 Encounter for screening for malignant neoplasm of colon: Secondary | ICD-10-CM

## 2018-12-17 LAB — UA/M W/RFLX CULTURE, ROUTINE
Bilirubin, UA: NEGATIVE
GLUCOSE, UA: NEGATIVE
LEUKOCYTES UA: NEGATIVE
NITRITE UA: NEGATIVE
Protein, UA: NEGATIVE
RBC, UA: NEGATIVE
SPEC GRAV UA: 1.02 (ref 1.005–1.030)
Urobilinogen, Ur: 0.2 mg/dL (ref 0.2–1.0)
pH, UA: 7 (ref 5.0–7.5)

## 2018-12-17 NOTE — Assessment & Plan Note (Signed)
Rechecking levels today. Await results. Call with any concerns.  

## 2018-12-17 NOTE — Progress Notes (Signed)
BP 140/86   Pulse 87   Ht 5\' 1"  (1.549 m)   Wt 204 lb 8 oz (92.8 kg)   LMP  (LMP Unknown)   SpO2 98%   BMI 38.64 kg/m    Subjective:    Patient ID: Marissa Reynolds, female    DOB: 10/19/1965, 53 y.o.   MRN: [id number]  HPI: Marissa Reynolds is a 53 y.o. female presenting on 12/17/2018 for comprehensive medical examination. Current medical complaints include:  Has been having pain in her R thumb. She notes that when she bends it it makes a clicky noise and then seems to get stuck and not move quite right. It's been a little uncomfortable, but not painful. She has been taping it. Had an x-ray which showed a bone spur.   In January her R knee started hurting when she was turning. It only happened this 1 time, hurt when she tried to straighten it out. She was concerned about having a torn meniscus. Has been using a brace and that has been helping. No more swelling. No more pain. Feeling better.   Still has ganglion cyst on the R knee- not bothering her.   Has had some other aches and pains, but they seem to be getting better.   Has some numbness on the R side of her leg- doesn't feel the same on the R and L side. Has never had any problems with her back.   SKIN LESION Duration: weeks Location: R breast Painful: no Itching: no Onset: gradual Context: not changing History of skin cancer: no History of precancerous skin lesions: no Family history of skin cancer: no  ???SLEEP APNEA Sleep apnea status: unknown- very tired during the day, has been falling asleep when in conversation, concerned about falling asleep while driving.  Duration: chronic  Treatments attempted: None Wakes feeling refreshed:  no Daytime hypersomnolence:  yes Fatigue:  yes Insomnia:  no Good sleep hygiene:  no Difficulty falling asleep:  no Difficulty staying asleep:  no Snoring bothers bed partner:  yes Observed apnea by bed partner: no Obesity:  yes Hypertension: no  Pulmonary hypertension:  no Coronary  artery disease:  no  Menopausal Symptoms: no  Depression Screen done today and results listed below:  Depression screen [facility] 2/9 12/17/2018 05/01/2017 10/19/2015 09/07/2015 09/07/2015  Decreased Interest 0 0 0 2 2  Down, Depressed, Hopeless 0 0 0 2 2  PHQ - 2 Score 0 0 0 4 4  Altered sleeping 1 - 2 3 3   Tired, decreased energy 1 - 0 3 3  Change in appetite 2 - 0 3 3  Feeling bad or failure about yourself  0 - 0 1 1  Trouble concentrating 0 - 0 1 1  Moving slowly or fidgety/restless 0 - 0 1 1  Suicidal thoughts 0 - 0 0 0  PHQ-9 Score 4 - 2 16 16   Difficult doing work/chores Not difficult at all - Somewhat difficult Somewhat difficult Somewhat difficult   GAD 7 : Generalized Anxiety Score 12/17/2018  Nervous, Anxious, on Edge 1  Control/stop worrying 0  Worry too much - different things 1  Trouble relaxing 0  Restless 0  Easily annoyed or irritable 0  Afraid - awful might happen 0  Total GAD 7 Score 2  Anxiety Difficulty Not difficult at all    Functional Status Survey: Is the patient deaf or have difficulty hearing?: No Does the patient have difficulty seeing, even when wearing glasses/contacts?: Yes Does the patient have difficulty  concentrating, remembering, or making decisions?: No Does the patient have difficulty walking or climbing stairs?: No Does the patient have difficulty dressing or bathing?: No Does the patient have difficulty doing errands alone such as visiting a doctor's office or shopping?: No   Past Medical History:  Past Medical History:  Diagnosis Date  . Asthma     Surgical History:  Past Surgical History:  Procedure Laterality Date  . CESAREAN SECTION      Medications:  Current Outpatient Medications on File Prior to Visit  Medication Sig  . albuterol (PROVENTIL HFA;VENTOLIN HFA) 108 (90 BASE) MCG/ACT inhaler Inhale 2 puffs into the lungs every 6 (six) hours as needed for wheezing or shortness of breath.  . ARNUITY ELLIPTA 100 MCG/ACT AEPB   .  loratadine (CLARITIN) 10 MG tablet Take 10 mg by mouth daily.  . beclomethasone (QVAR) 80 MCG/ACT inhaler Inhale 1 puff into the lungs 2 (two) times daily.  . Biotin 1000 MCG tablet Take 1,000 mcg by mouth 3 (three) times daily.  . Fluticasone Furoate 100 MCG/ACT AEPB Inhale into the lungs.  . Multiple Vitamin (MULTIVITAMIN) tablet Take 1 tablet by mouth daily.  Marland Kitchen. OVER THE COUNTER MEDICATION Vitamin B 6  . Potassium 75 MG TABS Take by mouth.   No current facility-administered medications on file prior to visit.     Allergies:  No Known Allergies  Social History:  Social History   Socioeconomic History  . Marital status: Married    Spouse name: Not on file  . Number of children: Not on file  . Years of education: Not on file  . Highest education level: Not on file  Occupational History  . Not on file  Social Needs  . Financial resource strain: Not on file  . Food insecurity:    Worry: Not on file    Inability: Not on file  . Transportation needs:    Medical: Not on file    Non-medical: Not on file  Tobacco Use  . Smoking status: Never Smoker  . Smokeless tobacco: Never Used  Substance and Sexual Activity  . Alcohol use: No  . Drug use: No  . Sexual activity: Yes    Birth control/protection: Post-menopausal  Lifestyle  . Physical activity:    Days per week: Not on file    Minutes per session: Not on file  . Stress: Not on file  Relationships  . Social connections:    Talks on phone: Not on file    Gets together: Not on file    Attends religious service: Not on file    Active member of club or organization: Not on file    Attends meetings of clubs or organizations: Not on file    Relationship status: Not on file  . Intimate partner violence:    Fear of current or ex partner: Not on file    Emotionally abused: Not on file    Physically abused: Not on file    Forced sexual activity: Not on file  Other Topics Concern  . Not on file  Social History Narrative  .  Not on file   Social History   Tobacco Use  Smoking Status Never Smoker  Smokeless Tobacco Never Used   Social History   Substance and Sexual Activity  Alcohol Use No    Family History:  Family History  Problem Relation Age of Onset  . Anemia Mother        RAEB-1  . Other Mother  RAEB 1  . Heart attack Father   . Kidney disease Father   . Stroke Maternal Grandmother   . Diabetes Maternal Grandfather   . Breast cancer Cousin     Past medical history, surgical history, medications, allergies, family history and social history reviewed with patient today and changes made to appropriate areas of the chart.   Review of Systems  Constitutional: Negative.   HENT: Negative.   Eyes: Negative.   Respiratory: Negative.   Cardiovascular: Negative.   Gastrointestinal: Positive for blood in stool (with straining- very occasionally). Negative for abdominal pain, constipation, diarrhea, heartburn, melena, nausea and vomiting.  Genitourinary: Negative.   Musculoskeletal: Negative.   Skin: Negative.   Endo/Heme/Allergies: Positive for environmental allergies. Negative for polydipsia. Does not bruise/bleed easily.    All other ROS negative except what is listed above and in the HPI.      Objective:    BP 140/86   Pulse 87   Ht  (1.549 m)   Wt 204 lb 8 oz (92.8 kg)   LMP  (LMP Unknown)   SpO2 98%   BMI 38.64 kg/m   Wt Readings from Last 3 Encounters:  12/17/18 204 lb 8 oz (92.8 kg)  10/23/17 165 lb 8 oz (75.1 kg)  07/17/17 134 lb 8 oz (61 kg)    Physical Exam Vitals signs and nursing note reviewed.  Constitutional:      General: She is not in acute distress.    Appearance: Normal appearance. She is not ill-appearing, toxic-appearing or diaphoretic.  HENT:     Head: Normocephalic and atraumatic.     Right Ear: Tympanic membrane, ear canal and external ear normal. There is no impacted cerumen.     Left Ear: Tympanic membrane, ear canal and external ear  normal. There is no impacted cerumen.     Nose: Nose normal. No congestion or rhinorrhea.     Mouth/Throat:     Mouth: Mucous membranes are moist.     Pharynx: Oropharynx is clear. No oropharyngeal exudate or posterior oropharyngeal erythema.  Eyes:     General: No scleral icterus.       Right eye: No discharge.        Left eye: No discharge.     Extraocular Movements: Extraocular movements intact.     Conjunctiva/sclera: Conjunctivae normal.     Pupils: Pupils are equal, round, and reactive to light.  Neck:     Musculoskeletal: Normal range of motion and neck supple. No neck rigidity or muscular tenderness.     Vascular: No carotid bruit.  Cardiovascular:     Rate and Rhythm: Normal rate and regular rhythm.     Pulses: Normal pulses.     Heart sounds: No murmur. No friction rub. No gallop.   Pulmonary:     Effort: Pulmonary effort is normal. No respiratory distress.     Breath sounds: Normal breath sounds. No stridor. No wheezing, rhonchi or rales.  Chest:     Chest wall: No tenderness.  Abdominal:     General: Abdomen is flat. Bowel sounds are normal. There is no distension.     Palpations: Abdomen is soft. There is no mass.     Tenderness: There is no abdominal tenderness. There is no right CVA tenderness, left CVA tenderness, guarding or rebound.     Hernia: No hernia is present.  Genitourinary:    Comments: Breast and pelvic exams deferred with shared decision making Musculoskeletal:  General: No swelling, tenderness, deformity or signs of injury.     Right lower leg: No edema.     Left lower leg: No edema.  Lymphadenopathy:     Cervical: No cervical adenopathy.  Skin:    General: Skin is warm and dry.     Capillary Refill: Capillary refill takes less than 2 seconds.     Coloration: Skin is not jaundiced or pale.     Findings: No bruising, erythema, lesion or rash.  Neurological:     General: No focal deficit present.     Mental Status: She is alert and oriented  to person, place, and time. Mental status is at baseline.     Cranial Nerves: No cranial nerve deficit.     Sensory: No sensory deficit.     Motor: No weakness.     Coordination: Coordination normal.     Gait: Gait normal.     Deep Tendon Reflexes: Reflexes normal.  Psychiatric:        Mood and Affect: Mood normal.        Behavior: Behavior normal.        Thought Content: Thought content normal.        Judgment: Judgment normal.     Results for orders placed or performed in visit on 10/23/17  Pap IG w/ reflex to HPV when ASC-U  Result Value Ref Range   DIAGNOSIS: Comment    Specimen adequacy: Comment    Clinician Provided ICD10 Comment    Performed by: Comment    PAP Smear Comment .    Note: Comment    Test Methodology Comment    PAP Reflex Comment       Assessment & Plan:   Problem List Items Addressed This Visit      Musculoskeletal and Integument   Arthritis    Will start aleve OTC. Call if not getting better or getting worse.         Other   Hyperlipidemia    Rechecking levels today. Await results. Call with any concerns.        Other Visit Diagnoses    Routine general medical examination at a health care facility    -  Primary   Vaccines up to date. Screening labs checked today. Pap up to date. Mammogram and FOBT ordered. Continue diet and exercise. Call with any concerns.    Relevant Orders   CBC with Differential/Platelet   Comprehensive metabolic panel   Lipid Panel w/o Chol/HDL Ratio   TSH   UA/M w/rflx Culture, Routine   Snoring       Concern for OSA- will check labs if normal, will get her sleep study. Call with any concerns.    Daytime hypersomnolence       Concern for OSA- will check labs if normal, will get her sleep study. Call with any concerns.    Screening for colon cancer       FOBT- order given today.   Relevant Orders   Fecal occult blood, imunochemical([facility])   Screening for breast cancer       Mammogram ordreed today.    Relevant Orders   MM DIGITAL SCREENING BILATERAL       Follow up plan: Return in about 1 year (around 12/17/2019) for physical.   LABORATORY TESTING:  - Pap smear: up to date  IMMUNIZATIONS:   - Tdap: Tetanus vaccination status reviewed: last tetanus booster within 10 years. - Influenza: Refused - Pneumovax: Not applicable  SCREENING: -Mammogram: Ordered  today  - Colonoscopy: stool cards ordered today   PATIENT COUNSELING:   Advised to take 1 mg of folate supplement per day if capable of pregnancy.   Sexuality: Discussed sexually transmitted diseases, partner selection, use of condoms, avoidance of unintended pregnancy  and contraceptive alternatives.   Advised to avoid cigarette smoking.  I discussed with the patient that most people either abstain from alcohol or drink within safe limits (<=14/week and <=4 drinks/occasion for males, <=7/weeks and <= 3 drinks/occasion for females) and that the risk for alcohol disorders and other health effects rises proportionally with the number of drinks per week and how often a drinker exceeds daily limits.  Discussed cessation/primary prevention of drug use and availability of treatment for abuse.   Diet: Encouraged to adjust caloric intake to maintain  or achieve ideal body weight, to reduce intake of dietary saturated fat and total fat, to limit sodium intake by avoiding high sodium foods and not adding table salt, and to maintain adequate dietary potassium and calcium preferably from fresh fruits, vegetables, and low-fat dairy products.    stressed the importance of regular exercise  Injury prevention: Discussed safety belts, safety helmets, smoke detector, smoking near bedding or upholstery.   Dental health: Discussed importance of regular tooth brushing, flossing, and dental visits.    NEXT PREVENTATIVE PHYSICAL DUE IN 1 YEAR. Return in about 1 year (around 12/17/2019) for physical.

## 2018-12-17 NOTE — Patient Instructions (Addendum)
Call for a Mammogram if you do not have insurance [phone] [facility])  Health Maintenance, Female Adopting a healthy lifestyle and getting preventive care can go a long way to promote health and wellness. Talk with your health care provider about what schedule of regular examinations is right for you. This is a good chance for you to check in with your provider about disease prevention and staying healthy. In between checkups, there are plenty of things you can do on your own. Experts have done a lot of research about which lifestyle changes and preventive measures are most likely to keep you healthy. Ask your health care provider for more information. Weight and diet Eat a healthy diet  Be sure to include plenty of vegetables, fruits, low-fat dairy products, and lean protein.  Do not eat a lot of foods high in solid fats, added sugars, or salt.  Get regular exercise. This is one of the most important things you can do for your health. ? Most adults should exercise for at least 150 minutes each week. The exercise should increase your heart rate and make you sweat (moderate-intensity exercise). ? Most adults should also do strengthening exercises at least twice a week. This is in addition to the moderate-intensity exercise. Maintain a healthy weight  Body mass index (BMI) is a measurement that can be used to identify possible weight problems. It estimates body fat based on height and weight. Your health care provider can help determine your BMI and help you achieve or maintain a healthy weight.  For females 65 years of age and older: ? A BMI below 18.5 is considered underweight. ? A BMI of 18.5 to 24.9 is normal. ? A BMI of 25 to 29.9 is considered overweight. ? A BMI of 30 and above is considered obese. Watch levels of cholesterol and blood lipids  You should start having your blood tested for lipids and cholesterol at 55 years of age, then have this test every 5 years.  You may need to  have your cholesterol levels checked more often if: ? Your lipid or cholesterol levels are high. ? You are older than 55 years of age. ? You are at high risk for heart disease. Cancer screening Lung Cancer  Lung cancer screening is recommended for adults 36-32 years old who are at high risk for lung cancer because of a history of smoking.  A yearly low-dose CT scan of the lungs is recommended for people who: ? Currently smoke. ? Have quit within the past 15 years. ? Have at least a 30-pack-year history of smoking. A pack year is smoking an average of one pack of cigarettes a day for 1 year.  Yearly screening should continue until it has been 15 years since you quit.  Yearly screening should stop if you develop a health problem that would prevent you from having lung cancer treatment. Breast Cancer  Practice breast self-awareness. This means understanding how your breasts normally appear and feel.  It also means doing regular breast self-exams. Let your health care provider know about any changes, no matter how small.  If you are in your 20s or 30s, you should have a clinical breast exam (CBE) by a health care provider every 1-3 years as part of a regular health exam.  If you are 37 or older, have a CBE every year. Also consider having a breast X-ray (mammogram) every year.  If you have a family history of breast cancer, talk to your health care provider about  genetic screening.  If you are at high risk for breast cancer, talk to your health care provider about having an MRI and a mammogram every year.  Breast cancer gene (BRCA) assessment is recommended for women who have family members with BRCA-related cancers. BRCA-related cancers include: ? Breast. ? Ovarian. ? Tubal. ? Peritoneal cancers.  Results of the assessment will determine the need for genetic counseling and BRCA1 and BRCA2 testing. Cervical Cancer Your health care provider may recommend that you be screened  regularly for cancer of the pelvic organs (ovaries, uterus, and vagina). This screening involves a pelvic examination, including checking for microscopic changes to the surface of your cervix (Pap test). You may be encouraged to have this screening done every 3 years, beginning at age 63.  For women ages 66-65, health care providers may recommend pelvic exams and Pap testing every 3 years, or they may recommend the Pap and pelvic exam, combined with testing for human papilloma virus (HPV), every 5 years. Some types of HPV increase your risk of cervical cancer. Testing for HPV may also be done on women of any age with unclear Pap test results.  Other health care providers may not recommend any screening for nonpregnant women who are considered low risk for pelvic cancer and who do not have symptoms. Ask your health care provider if a screening pelvic exam is right for you.  If you have had past treatment for cervical cancer or a condition that could lead to cancer, you need Pap tests and screening for cancer for at least 20 years after your treatment. If Pap tests have been discontinued, your risk factors (such as having a new sexual partner) need to be reassessed to determine if screening should resume. Some women have medical problems that increase the chance of getting cervical cancer. In these cases, your health care provider may recommend more frequent screening and Pap tests. Colorectal Cancer  This type of cancer can be detected and often prevented.  Routine colorectal cancer screening usually begins at 55 years of age and continues through 55 years of age.  Your health care provider may recommend screening at an earlier age if you have risk factors for colon cancer.  Your health care provider may also recommend using home test kits to check for hidden blood in the stool.  A small camera at the end of a tube can be used to examine your colon directly (sigmoidoscopy or colonoscopy). This is  done to check for the earliest forms of colorectal cancer.  Routine screening usually begins at age 47.  Direct examination of the colon should be repeated every 5-10 years through 55 years of age. However, you may need to be screened more often if early forms of precancerous polyps or small growths are found. Skin Cancer  Check your skin from head to toe regularly.  Tell your health care provider about any new moles or changes in moles, especially if there is a change in a mole's shape or color.  Also tell your health care provider if you have a mole that is larger than the size of a pencil eraser.  Always use sunscreen. Apply sunscreen liberally and repeatedly throughout the day.  Protect yourself by wearing long sleeves, pants, a wide-brimmed hat, and sunglasses whenever you are outside. Heart disease, diabetes, and high blood pressure  High blood pressure causes heart disease and increases the risk of stroke. High blood pressure is more likely to develop in: ? People who have blood pressure  in the high end of the normal range (130-139/85-89 mm Hg). ? People who are overweight or obese. ? People who are African American.  If you are 5-13 years of age, have your blood pressure checked every 3-5 years. If you are 20 years of age or older, have your blood pressure checked every year. You should have your blood pressure measured twice-once when you are at a hospital or clinic, and once when you are not at a hospital or clinic. Record the average of the two measurements. To check your blood pressure when you are not at a hospital or clinic, you can use: ? An automated blood pressure machine at a pharmacy. ? A home blood pressure monitor.  If you are between 79 years and 41 years old, ask your health care provider if you should take aspirin to prevent strokes.  Have regular diabetes screenings. This involves taking a blood sample to check your fasting blood sugar level. ? If you are at a  normal weight and have a low risk for diabetes, have this test once every three years after 55 years of age. ? If you are overweight and have a high risk for diabetes, consider being tested at a younger age or more often. Preventing infection Hepatitis B  If you have a higher risk for hepatitis B, you should be screened for this virus. You are considered at high risk for hepatitis B if: ? You were born in a country where hepatitis B is common. Ask your health care provider which countries are considered high risk. ? Your parents were born in a high-risk country, and you have not been immunized against hepatitis B (hepatitis B vaccine). ? You have HIV or AIDS. ? You use needles to inject street drugs. ? You live with someone who has hepatitis B. ? You have had sex with someone who has hepatitis B. ? You get hemodialysis treatment. ? You take certain medicines for conditions, including cancer, organ transplantation, and autoimmune conditions. Hepatitis C  Blood testing is recommended for: ? Everyone born from [birth year] through 1967. ? Anyone with known risk factors for hepatitis C. Sexually transmitted infections (STIs)  You should be screened for sexually transmitted infections (STIs) including gonorrhea and chlamydia if: ? You are sexually active and are younger than 55 years of age. ? You are older than 55 years of age and your health care provider tells you that you are at risk for this type of infection. ? Your sexual activity has changed since you were last screened and you are at an increased risk for chlamydia or gonorrhea. Ask your health care provider if you are at risk.  If you do not have HIV, but are at risk, it may be recommended that you take a prescription medicine daily to prevent HIV infection. This is called pre-exposure prophylaxis (PrEP). You are considered at risk if: ? You are sexually active and do not regularly use condoms or know the HIV status of your partner(s). ? You  take drugs by injection. ? You are sexually active with a partner who has HIV. Talk with your health care provider about whether you are at high risk of being infected with HIV. If you choose to begin PrEP, you should first be tested for HIV. You should then be tested every 3 months for as long as you are taking PrEP. Pregnancy  If you are premenopausal and you may become pregnant, ask your health care provider about preconception counseling.  If you  may become pregnant, take 400 to 800 micrograms (mcg) of folic acid every day.  If you want to prevent pregnancy, talk to your health care provider about birth control (contraception). Osteoporosis and menopause  Osteoporosis is a disease in which the bones lose minerals and strength with aging. This can result in serious bone fractures. Your risk for osteoporosis can be identified using a bone density scan.  If you are 78 years of age or older, or if you are at risk for osteoporosis and fractures, ask your health care provider if you should be screened.  Ask your health care provider whether you should take a calcium or vitamin D supplement to lower your risk for osteoporosis.  Menopause may have certain physical symptoms and risks.  Hormone replacement therapy may reduce some of these symptoms and risks. Talk to your health care provider about whether hormone replacement therapy is right for you. Follow these instructions at home:  Schedule regular health, dental, and eye exams.  Stay current with your immunizations.  Do not use any tobacco products including cigarettes, chewing tobacco, or electronic cigarettes.  If you are pregnant, do not drink alcohol.  If you are breastfeeding, limit how much and how often you drink alcohol.  Limit alcohol intake to no more than 1 drink per day for nonpregnant women. One drink equals 12 ounces of beer, 5 ounces of wine, or 1 ounces of hard liquor.  Do not use street drugs.  Do not share  needles.  Ask your health care provider for help if you need support or information about quitting drugs.  Tell your health care provider if you often feel depressed.  Tell your health care provider if you have ever been abused or do not feel safe at home. This information is not intended to replace advice given to you by your health care provider. Make sure you discuss any questions you have with your health care provider. Document Released: 04/17/2011 Document Revised: 03/09/2016 Document Reviewed: 07/06/2015 Elsevier Interactive Patient Education  2019 [employer].

## 2018-12-17 NOTE — Assessment & Plan Note (Signed)
Will start aleve OTC. Call if not getting better or getting worse.

## 2018-12-18 ENCOUNTER — Encounter: Payer: Self-pay | Admitting: Family Medicine

## 2018-12-18 LAB — CBC WITH DIFFERENTIAL/PLATELET
BASOS ABS: 0.1 10*3/uL (ref 0.0–0.2)
Basos: 1 %
EOS (ABSOLUTE): 0.3 10*3/uL (ref 0.0–0.4)
Eos: 4 %
HEMOGLOBIN: 12.8 g/dL (ref 11.1–15.9)
Hematocrit: 37 % (ref 34.0–46.6)
IMMATURE GRANS (ABS): 0 10*3/uL (ref 0.0–0.1)
IMMATURE GRANULOCYTES: 0 %
LYMPHS: 32 %
Lymphocytes Absolute: 2.4 10*3/uL (ref 0.7–3.1)
MCH: 30.2 pg (ref 26.6–33.0)
MCHC: 34.6 g/dL (ref 31.5–35.7)
MCV: 87 fL (ref 79–97)
MONOCYTES: 6 %
Monocytes Absolute: 0.5 10*3/uL (ref 0.1–0.9)
NEUTROS ABS: 4.3 10*3/uL (ref 1.4–7.0)
NEUTROS PCT: 57 %
Platelets: 275 10*3/uL (ref 150–450)
RBC: 4.24 x10E6/uL (ref 3.77–5.28)
RDW: 13 % (ref 11.7–15.4)
WBC: 7.4 10*3/uL (ref 3.4–10.8)

## 2018-12-18 LAB — COMPREHENSIVE METABOLIC PANEL
ALBUMIN: 4.3 g/dL (ref 3.8–4.9)
ALK PHOS: 59 IU/L (ref 39–117)
ALT: 31 IU/L (ref 0–32)
AST: 24 IU/L (ref 0–40)
Albumin/Globulin Ratio: 1.4 (ref 1.2–2.2)
BILIRUBIN TOTAL: 0.2 mg/dL (ref 0.0–1.2)
BUN / CREAT RATIO: 22 (ref 9–23)
BUN: 14 mg/dL (ref 6–24)
CO2: 26 mmol/L (ref 20–29)
Calcium: 9.4 mg/dL (ref 8.7–10.2)
Chloride: 101 mmol/L (ref 96–106)
Creatinine, Ser: 0.65 mg/dL (ref 0.57–1.00)
GFR calc Af Amer: 116 mL/min/{1.73_m2} (ref >59)
GFR calc non Af Amer: 101 mL/min/{1.73_m2} (ref >59)
GLOBULIN, TOTAL: 3 g/dL (ref 1.5–4.5)
GLUCOSE: 100 mg/dL — AB (ref 65–99)
Potassium: 4.5 mmol/L (ref 3.5–5.2)
SODIUM: 144 mmol/L (ref 134–144)
Total Protein: 7.3 g/dL (ref 6.0–8.5)

## 2018-12-18 LAB — TSH: TSH: 2.69 u[IU]/mL (ref 0.450–4.500)

## 2018-12-18 LAB — LIPID PANEL W/O CHOL/HDL RATIO
CHOLESTEROL TOTAL: 208 mg/dL — AB (ref 100–199)
HDL: 48 mg/dL (ref >39)
LDL Calculated: 111 mg/dL — ABNORMAL HIGH (ref 0–99)
TRIGLYCERIDES: 245 mg/dL — AB (ref 0–149)
VLDL Cholesterol Cal: 49 mg/dL — ABNORMAL HIGH (ref 5–40)

## 2018-12-19 ENCOUNTER — Encounter: Payer: Self-pay | Admitting: Family Medicine

## 2018-12-19 DIAGNOSIS — G4719 Other hypersomnia: Secondary | ICD-10-CM

## 2018-12-19 DIAGNOSIS — R0683 Snoring: Secondary | ICD-10-CM

## 2019-07-23 ENCOUNTER — Encounter: Payer: Self-pay | Admitting: Family Medicine

## 2019-09-09 ENCOUNTER — Other Ambulatory Visit: Payer: Self-pay

## 2019-09-09 DIAGNOSIS — Z20822 Contact with and (suspected) exposure to covid-19: Secondary | ICD-10-CM

## 2019-09-10 LAB — NOVEL CORONAVIRUS, NAA: SARS-CoV-2, NAA: NOT DETECTED

## 2019-10-07 ENCOUNTER — Ambulatory Visit: Payer: HRSA Program | Attending: Internal Medicine

## 2019-10-07 DIAGNOSIS — Z20822 Contact with and (suspected) exposure to covid-19: Secondary | ICD-10-CM

## 2019-10-07 DIAGNOSIS — Z20828 Contact with and (suspected) exposure to other viral communicable diseases: Secondary | ICD-10-CM | POA: Diagnosis present

## 2019-10-08 LAB — NOVEL CORONAVIRUS, NAA: SARS-CoV-2, NAA: NOT DETECTED

## 2019-10-09 ENCOUNTER — Telehealth: Payer: Self-pay | Admitting: Family Medicine

## 2019-10-09 NOTE — Telephone Encounter (Signed)
Pt is aware covid 19 test is neg on 10/09/2019 

## 2019-10-27 ENCOUNTER — Other Ambulatory Visit: Payer: Self-pay

## 2020-02-10 ENCOUNTER — Ambulatory Visit (INDEPENDENT_AMBULATORY_CARE_PROVIDER_SITE_OTHER): Payer: Self-pay | Admitting: Family Medicine

## 2020-02-10 ENCOUNTER — Encounter: Payer: Self-pay | Admitting: Family Medicine

## 2020-02-10 ENCOUNTER — Other Ambulatory Visit: Payer: Self-pay

## 2020-02-10 ENCOUNTER — Ambulatory Visit
Admission: RE | Admit: 2020-02-10 | Discharge: 2020-02-10 | Disposition: A | Discharge status: Home / Self Care | Payer: Self-pay | Source: Ambulatory Visit | Attending: Family Medicine | Admitting: Family Medicine

## 2020-02-10 VITALS — BP 125/74 | HR 81 | Temp 97.9°F | Ht 60.24 in | Wt 218.8 lb

## 2020-02-10 DIAGNOSIS — G8929 Other chronic pain: Secondary | ICD-10-CM

## 2020-02-10 DIAGNOSIS — M25561 Pain in right knee: Secondary | ICD-10-CM | POA: Insufficient documentation

## 2020-02-10 NOTE — Patient Instructions (Signed)
Journal for Nurse Practitioners, 15(4), 263-267. Retrieved July 22, 2018 from http://clinicalkey.com/nursing">  Knee Exercises Ask your health care provider which exercises are safe for you. Do exercises exactly as told by your health care provider and adjust them as directed. It is normal to feel mild stretching, pulling, tightness, or discomfort as you do these exercises. Stop right away if you feel sudden pain or your pain gets worse. Do not begin these exercises until told by your health care provider. Stretching and range-of-motion exercises These exercises warm up your muscles and joints and improve the movement and flexibility of your knee. These exercises also help to relieve pain and swelling. Knee extension, prone 1. Lie on your abdomen (prone position) on a bed. 2. Place your left / right knee just beyond the edge of the surface so your knee is not on the bed. You can put a towel under your left / right thigh just above your kneecap for comfort. 3. Relax your leg muscles and allow gravity to straighten your knee (extension). You should feel a stretch behind your left / right knee. 4. Hold this position for __________ seconds. 5. Scoot up so your knee is supported between repetitions. Repeat __________ times. Complete this exercise __________ times a day. Knee flexion, active  1. Lie on your back with both legs straight. If this causes back discomfort, bend your left / right knee so your foot is flat on the floor. 2. Slowly slide your left / right heel back toward your buttocks. Stop when you feel a gentle stretch in the front of your knee or thigh (flexion). 3. Hold this position for __________ seconds. 4. Slowly slide your left / right heel back to the starting position. Repeat __________ times. Complete this exercise __________ times a day. Quadriceps stretch, prone  1. Lie on your abdomen on a firm surface, such as a bed or padded floor. 2. Bend your left / right knee and hold  your ankle. If you cannot reach your ankle or pant leg, loop a belt around your foot and grab the belt instead. 3. Gently pull your heel toward your buttocks. Your knee should not slide out to the side. You should feel a stretch in the front of your thigh and knee (quadriceps). 4. Hold this position for __________ seconds. Repeat __________ times. Complete this exercise __________ times a day. Hamstring, supine 1. Lie on your back (supine position). 2. Loop a belt or towel over the ball of your left / right foot. The ball of your foot is on the walking surface, right under your toes. 3. Straighten your left / right knee and slowly pull on the belt to raise your leg until you feel a gentle stretch behind your knee (hamstring). ? Do not let your knee bend while you do this. ? Keep your other leg flat on the floor. 4. Hold this position for __________ seconds. Repeat __________ times. Complete this exercise __________ times a day. Strengthening exercises These exercises build strength and endurance in your knee. Endurance is the ability to use your muscles for a long time, even after they get tired. Quadriceps, isometric This exercise stretches the muscles in front of your thigh (quadriceps) without moving your knee joint (isometric). 1. Lie on your back with your left / right leg extended and your other knee bent. Put a rolled towel or small pillow under your knee if told by your health care provider. 2. Slowly tense the muscles in the front of your left /   right thigh. You should see your kneecap slide up toward your hip or see increased dimpling just above the knee. This motion will push the back of the knee toward the floor. 3. For __________ seconds, hold the muscle as tight as you can without increasing your pain. 4. Relax the muscles slowly and completely. Repeat __________ times. Complete this exercise __________ times a day. Straight leg raises This exercise stretches the muscles in front  of your thigh (quadriceps) and the muscles that move your hips (hip flexors). 1. Lie on your back with your left / right leg extended and your other knee bent. 2. Tense the muscles in the front of your left / right thigh. You should see your kneecap slide up or see increased dimpling just above the knee. Your thigh may even shake a bit. 3. Keep these muscles tight as you raise your leg 4-6 inches (10-15 cm) off the floor. Do not let your knee bend. 4. Hold this position for __________ seconds. 5. Keep these muscles tense as you lower your leg. 6. Relax your muscles slowly and completely after each repetition. Repeat __________ times. Complete this exercise __________ times a day. Hamstring, isometric 1. Lie on your back on a firm surface. 2. Bend your left / right knee about __________ degrees. 3. Dig your left / right heel into the surface as if you are trying to pull it toward your buttocks. Tighten the muscles in the back of your thighs (hamstring) to "dig" as hard as you can without increasing any pain. 4. Hold this position for __________ seconds. 5. Release the tension gradually and allow your muscles to relax completely for __________ seconds after each repetition. Repeat __________ times. Complete this exercise __________ times a day. Hamstring curls If told by your health care provider, do this exercise while wearing ankle weights. Begin with __________ lb weights. Then increase the weight by 1 lb (0.5 kg) increments. Do not wear ankle weights that are more than __________ lb. 1. Lie on your abdomen with your legs straight. 2. Bend your left / right knee as far as you can without feeling pain. Keep your hips flat against the floor. 3. Hold this position for __________ seconds. 4. Slowly lower your leg to the starting position. Repeat __________ times. Complete this exercise __________ times a day. Squats This exercise strengthens the muscles in front of your thigh and knee  (quadriceps). 1. Stand in front of a table, with your feet and knees pointing straight ahead. You may rest your hands on the table for balance but not for support. 2. Slowly bend your knees and lower your hips like you are going to sit in a chair. ? Keep your weight over your heels, not over your toes. ? Keep your lower legs upright so they are parallel with the table legs. ? Do not let your hips go lower than your knees. ? Do not bend lower than told by your health care provider. ? If your knee pain increases, do not bend as low. 3. Hold the squat position for __________ seconds. 4. Slowly push with your legs to return to standing. Do not use your hands to pull yourself to standing. Repeat __________ times. Complete this exercise __________ times a day. Wall slides This exercise strengthens the muscles in front of your thigh and knee (quadriceps). 1. Lean your back against a smooth wall or door, and walk your feet out 18-24 inches (46-61 cm) from it. 2. Place your feet hip-width apart. 3.   Slowly slide down the wall or door until your knees bend __________ degrees. Keep your knees over your heels, not over your toes. Keep your knees in line with your hips. 4. Hold this position for __________ seconds. Repeat __________ times. Complete this exercise __________ times a day. Straight leg raises This exercise strengthens the muscles that rotate the leg at the hip and move it away from your body (hip abductors). 1. Lie on your side with your left / right leg in the top position. Lie so your head, shoulder, knee, and hip line up. You may bend your bottom knee to help you keep your balance. 2. Roll your hips slightly forward so your hips are stacked directly over each other and your left / right knee is facing forward. 3. Leading with your heel, lift your top leg 4-6 inches (10-15 cm). You should feel the muscles in your outer hip lifting. ? Do not let your foot drift forward. ? Do not let your knee  roll toward the ceiling. 4. Hold this position for __________ seconds. 5. Slowly return your leg to the starting position. 6. Let your muscles relax completely after each repetition. Repeat __________ times. Complete this exercise __________ times a day. Straight leg raises This exercise stretches the muscles that move your hips away from the front of the pelvis (hip extensors). 1. Lie on your abdomen on a firm surface. You can put a pillow under your hips if that is more comfortable. 2. Tense the muscles in your buttocks and lift your left / right leg about 4-6 inches (10-15 cm). Keep your knee straight as you lift your leg. 3. Hold this position for __________ seconds. 4. Slowly lower your leg to the starting position. 5. Let your leg relax completely after each repetition. Repeat __________ times. Complete this exercise __________ times a day. This information is not intended to replace advice given to you by your health care provider. Make sure you discuss any questions you have with your health care provider. Document Revised: 07/23/2018 Document Reviewed: 07/23/2018 Elsevier Patient Education  2020 [employer]  

## 2020-02-10 NOTE — Progress Notes (Signed)
BP 125/74 (BP Location: Left Arm, Patient Position: Sitting, Cuff Size: Normal)   Pulse 81   Temp 97.9 F (36.6 C) (Oral)   Ht 5' 0.24" (1.53 m)   Wt 218 lb 12.8 oz (99.2 kg)   LMP  (LMP Unknown)   SpO2 100%   BMI 42.40 kg/m    Subjective:    Patient ID: Marissa Reynolds, female    DOB: 20-May-1966, 54 y.o.   MRN: [id number]  HPI: Marissa Reynolds is a 54 y.o. female  Chief Complaint  Patient presents with  . Knee Pain    right   KNEE PAIN Duration: 10+ years Involved knee: right Mechanism of injury: unknown Location:diffuse Onset: gradual Severity: severe  Quality:  Aching and sore Frequency: constant Radiation: no Aggravating factors: weight bearing, walking, running, stairs, bending and movement  Alleviating factors: nothing, ice, HEP, APAP, NSAIDs, brace and rest  Status: worse Treatments attempted: 2 knee braces, laser machine at vet's office, aleve, heating pad, ice   Relief with NSAIDs?:  mild Weakness with weight bearing or walking: no Sensation of giving way: no Locking: no Popping: no Bruising: no Swelling: yes Redness: no Paresthesias/decreased sensation: no Fevers: no  Relevant past medical, surgical, family and social history reviewed and updated as indicated. Interim medical history since our last visit reviewed. Allergies and medications reviewed and updated.  Review of Systems  Constitutional: Negative.   Respiratory: Negative.   Cardiovascular: Negative.   Gastrointestinal: Negative.   Musculoskeletal: Positive for arthralgias, gait problem and joint swelling. Negative for back pain, myalgias, neck pain and neck stiffness.  Skin: Negative.   Psychiatric/Behavioral: Negative.     Per HPI unless specifically indicated above     Objective:    BP 125/74 (BP Location: Left Arm, Patient Position: Sitting, Cuff Size: Normal)   Pulse 81   Temp 97.9 F (36.6 C) (Oral)   Ht 5' 0.24" (1.53 m)   Wt 218 lb 12.8 oz (99.2 kg)   LMP  (LMP Unknown)    SpO2 100%   BMI 42.40 kg/m   Wt Readings from Last 3 Encounters:  02/10/20 218 lb 12.8 oz (99.2 kg)  12/17/18 204 lb 8 oz (92.8 kg)  10/23/17 165 lb 8 oz (75.1 kg)    Physical Exam Vitals and nursing note reviewed.  Constitutional:      General: She is not in acute distress.    Appearance: Normal appearance. She is not ill-appearing, toxic-appearing or diaphoretic.  HENT:     Head: Normocephalic and atraumatic.     Right Ear: External ear normal.     Left Ear: External ear normal.     Nose: Nose normal.     Mouth/Throat:     Mouth: Mucous membranes are moist.     Pharynx: Oropharynx is clear.  Eyes:     General: No scleral icterus.       Right eye: No discharge.        Left eye: No discharge.     Extraocular Movements: Extraocular movements intact.     Conjunctiva/sclera: Conjunctivae normal.     Pupils: Pupils are equal, round, and reactive to light.  Cardiovascular:     Rate and Rhythm: Normal rate and regular rhythm.     Pulses: Normal pulses.     Heart sounds: Normal heart sounds. No murmur. No friction rub. No gallop.   Pulmonary:     Effort: Pulmonary effort is normal. No respiratory distress.     Breath sounds: Normal breath sounds.  No stridor. No wheezing, rhonchi or rales.  Chest:     Chest wall: No tenderness.  Musculoskeletal:        General: Swelling (mild effusion of R knee) and tenderness (along joint line) present. No deformity. Normal range of motion.     Cervical back: Normal range of motion and neck supple.  Skin:    General: Skin is warm and dry.     Capillary Refill: Capillary refill takes less than 2 seconds.     Coloration: Skin is not jaundiced or pale.     Findings: No bruising, erythema, lesion or rash.  Neurological:     General: No focal deficit present.     Mental Status: She is alert and oriented to person, place, and time. Mental status is at baseline.  Psychiatric:        Mood and Affect: Mood normal.        Behavior: Behavior normal.         Thought Content: Thought content normal.        Judgment: Judgment normal.     Results for orders placed or performed in visit on 10/07/19  Novel Coronavirus, NAA ([facility])   Specimen: Nasopharyngeal(NP) swabs in vial transport medium   NASOPHARYNGE  TESTING  Result Value Ref Range   SARS-CoV-2, NAA Not Detected Not Detected      Assessment & Plan:   Problem List Items Addressed This Visit    None    Visit Diagnoses    Chronic pain of right knee    -  Primary   Will obtain x-ray and treat as needed. Hx of ganglion cyst. May need to go back to ortho. Discussed steroid injection. Call with any concerns.    Relevant Medications   naproxen sodium (ALEVE) 220 MG tablet   Other Relevant Orders   DG Knee Complete 4 Views Right       Follow up plan: Return asap physical.

## 2020-02-23 ENCOUNTER — Encounter: Payer: Self-pay | Admitting: Family Medicine

## 2020-02-23 ENCOUNTER — Other Ambulatory Visit: Payer: Self-pay

## 2020-02-23 ENCOUNTER — Ambulatory Visit (INDEPENDENT_AMBULATORY_CARE_PROVIDER_SITE_OTHER): Payer: Self-pay | Admitting: Family Medicine

## 2020-02-23 VITALS — BP 131/78 | HR 75 | Temp 98.5°F | Ht 60.24 in | Wt 210.8 lb

## 2020-02-23 DIAGNOSIS — E782 Mixed hyperlipidemia: Secondary | ICD-10-CM

## 2020-02-23 DIAGNOSIS — M25561 Pain in right knee: Secondary | ICD-10-CM

## 2020-02-23 DIAGNOSIS — Z1231 Encounter for screening mammogram for malignant neoplasm of breast: Secondary | ICD-10-CM

## 2020-02-23 DIAGNOSIS — Z0000 Encounter for general adult medical examination without abnormal findings: Secondary | ICD-10-CM

## 2020-02-23 DIAGNOSIS — G8929 Other chronic pain: Secondary | ICD-10-CM

## 2020-02-23 NOTE — Assessment & Plan Note (Signed)
Rechecking labs today. Await results. Treat as needed.  °

## 2020-02-23 NOTE — Patient Instructions (Addendum)
Call to schedule your mammogram: [phone]   Health Maintenance, Female Adopting a healthy lifestyle and getting preventive care are important in promoting health and wellness. Ask your health care provider about:  The right schedule for you to have regular tests and exams.  Things you can do on your own to prevent diseases and keep yourself healthy. What should I know about diet, weight, and exercise? Eat a healthy diet   Eat a diet that includes plenty of vegetables, fruits, low-fat dairy products, and lean protein.  Do not eat a lot of foods that are high in solid fats, added sugars, or sodium. Maintain a healthy weight Body mass index (BMI) is used to identify weight problems. It estimates body fat based on height and weight. Your health care provider can help determine your BMI and help you achieve or maintain a healthy weight. Get regular exercise Get regular exercise. This is one of the most important things you can do for your health. Most adults should:  Exercise for at least 150 minutes each week. The exercise should increase your heart rate and make you sweat (moderate-intensity exercise).  Do strengthening exercises at least twice a week. This is in addition to the moderate-intensity exercise.  Spend less time sitting. Even light physical activity can be beneficial. Watch cholesterol and blood lipids Have your blood tested for lipids and cholesterol at 56 years of age, then have this test every 5 years. Have your cholesterol levels checked more often if:  Your lipid or cholesterol levels are high.  You are older than 56 years of age.  You are at high risk for heart disease. What should I know about cancer screening? Depending on your health history and family history, you may need to have cancer screening at various ages. This may include screening for:  Breast cancer.  Cervical cancer.  Colorectal cancer.  Skin cancer.  Lung cancer. What should I know  about heart disease, diabetes, and high blood pressure? Blood pressure and heart disease  High blood pressure causes heart disease and increases the risk of stroke. This is more likely to develop in people who have high blood pressure readings, are of African descent, or are overweight.  Have your blood pressure checked: ? Every 3-5 years if you are 77-38 years of age. ? Every year if you are 31 years old or older. Diabetes Have regular diabetes screenings. This checks your fasting blood sugar level. Have the screening done:  Once every three years after age 74 if you are at a normal weight and have a low risk for diabetes.  More often and at a younger age if you are overweight or have a high risk for diabetes. What should I know about preventing infection? Hepatitis B If you have a higher risk for hepatitis B, you should be screened for this virus. Talk with your health care provider to find out if you are at risk for hepatitis B infection. Hepatitis C Testing is recommended for:  Everyone born from [birth year] through 1967.  Anyone with known risk factors for hepatitis C. Sexually transmitted infections (STIs)  Get screened for STIs, including gonorrhea and chlamydia, if: ? You are sexually active and are younger than 56 years of age. ? You are older than 56 years of age and your health care provider tells you that you are at risk for this type of infection. ? Your sexual activity has changed since you were last screened, and you are at increased risk for chlamydia  or gonorrhea. Ask your health care provider if you are at risk.  Ask your health care provider about whether you are at high risk for HIV. Your health care provider may recommend a prescription medicine to help prevent HIV infection. If you choose to take medicine to prevent HIV, you should first get tested for HIV. You should then be tested every 3 months for as long as you are taking the medicine. Pregnancy  If you are about  to stop having your period (premenopausal) and you may become pregnant, seek counseling before you get pregnant.  Take 400 to 800 micrograms (mcg) of folic acid every day if you become pregnant.  Ask for birth control (contraception) if you want to prevent pregnancy. Osteoporosis and menopause Osteoporosis is a disease in which the bones lose minerals and strength with aging. This can result in bone fractures. If you are 47 years old or older, or if you are at risk for osteoporosis and fractures, ask your health care provider if you should:  Be screened for bone loss.  Take a calcium or vitamin D supplement to lower your risk of fractures.  Be given hormone replacement therapy (HRT) to treat symptoms of menopause. Follow these instructions at home: Lifestyle  Do not use any products that contain nicotine or tobacco, such as cigarettes, e-cigarettes, and chewing tobacco. If you need help quitting, ask your health care provider.  Do not use street drugs.  Do not share needles.  Ask your health care provider for help if you need support or information about quitting drugs. Alcohol use  Do not drink alcohol if: ? Your health care provider tells you not to drink. ? You are pregnant, may be pregnant, or are planning to become pregnant.  If you drink alcohol: ? Limit how much you use to 0-1 drink a day. ? Limit intake if you are breastfeeding.  Be aware of how much alcohol is in your drink. In the [place] one drink equals one 12 oz bottle of beer (355 mL), one 5 oz glass of wine (148 mL), or one 1 oz glass of hard liquor (44 mL). General instructions  Schedule regular health, dental, and eye exams.  Stay current with your vaccines.  Tell your health care provider if: ? You often feel depressed. ? You have ever been abused or do not feel safe at home. Summary  Adopting a healthy lifestyle and getting preventive care are important in promoting health and wellness.  Follow your  health care provider's instructions about healthy diet, exercising, and getting tested or screened for diseases.  Follow your health care provider's instructions on monitoring your cholesterol and blood pressure. This information is not intended to replace advice given to you by your health care provider. Make sure you discuss any questions you have with your health care provider. Document Revised: 09/25/2018 Document Reviewed: 09/25/2018 Elsevier Patient Education  2020 [employer].

## 2020-02-23 NOTE — Progress Notes (Signed)
BP (!) 176/82 (BP Location: Left Arm, Patient Position: Sitting, Cuff Size: Normal)   Pulse 75   Temp 98.5 F (36.9 C) (Oral)   Ht 5' 0.24" (1.53 m)   Wt 210 lb 12.8 oz (95.6 kg)   LMP  (LMP Unknown)   SpO2 99%   BMI 40.85 kg/m    Subjective:    Patient ID: Marissa Reynolds, female    DOB: [birth year], 54 y.o.   MRN: [id number]  HPI: Marissa Reynolds is a 54 y.o. female presenting on 02/23/2020 for comprehensive medical examination. Current medical complaints include: knee pain is better, but continues to bother her. She would like a knee injection today  She currently lives with: husband Menopausal Symptoms: no  Depression Screen done today and results listed below:  Depression screen [facility] 2/9 02/10/2020 12/17/2018 05/01/2017 10/19/2015 09/07/2015  Decreased Interest 0 0 0 0 2  Down, Depressed, Hopeless 0 0 0 0 2  PHQ - 2 Score 0 0 0 0 4  Altered sleeping 2 1 - 2 3  Tired, decreased energy 1 1 - 0 3  Change in appetite 3 2 - 0 3  Feeling bad or failure about yourself  0 0 - 0 1  Trouble concentrating 0 0 - 0 1  Moving slowly or fidgety/restless 0 0 - 0 1  Suicidal thoughts 0 0 - 0 0  PHQ-9 Score 6 4 - 2 16  Difficult doing work/chores Not difficult at all Not difficult at all - Somewhat difficult Somewhat difficult      Past Medical History:  Past Medical History:  Diagnosis Date  . Asthma     Surgical History:  Past Surgical History:  Procedure Laterality Date  . CESAREAN SECTION      Medications:  Current Outpatient Medications on File Prior to Visit  Medication Sig  . albuterol (PROVENTIL HFA;VENTOLIN HFA) 108 (90 BASE) MCG/ACT inhaler Inhale 2 puffs into the lungs every 6 (six) hours as needed for wheezing or shortness of breath.  . ARNUITY ELLIPTA 100 MCG/ACT AEPB   . Biotin 1000 MCG tablet Take 1,000 mcg by mouth 3 (three) times daily.  Marland Kitchen glucosamine-chondroitin 500-400 MG tablet Take 1 tablet by mouth daily.  Marland Kitchen loratadine (CLARITIN) 10 MG tablet Take 10 mg by mouth  daily.  . Multiple Vitamin (MULTIVITAMIN) tablet Take 1 tablet by mouth daily.  . naproxen sodium (ALEVE) 220 MG tablet Take 220 mg by mouth daily as needed (2 tabs daily).  Marland Kitchen OVER THE COUNTER MEDICATION Vitamin B 6  . Potassium 75 MG TABS Take by mouth.   No current facility-administered medications on file prior to visit.    Allergies:  No Known Allergies  Social History:  Social History   Socioeconomic History  . Marital status: Married    Spouse name: Not on file  . Number of children: Not on file  . Years of education: Not on file  . Highest education level: Not on file  Occupational History  . Not on file  Tobacco Use  . Smoking status: Never Smoker  . Smokeless tobacco: Never Used  Substance and Sexual Activity  . Alcohol use: No  . Drug use: No  . Sexual activity: Yes    Birth control/protection: Post-menopausal  Other Topics Concern  . Not on file  Social History Narrative  . Not on file   Social Determinants of Health   Financial Resource Strain:   . Difficulty of Paying Living Expenses:   Food Insecurity:   .  Worried About Programme researcher, broadcasting/film/video in the Last Year:   . Barista in the Last Year:   Transportation Needs:   . Freight forwarder (Medical):   Marland Kitchen Lack of Transportation (Non-Medical):   Physical Activity:   . Days of Exercise per Week:   . Minutes of Exercise per Session:   Stress:   . Feeling of Stress :   Social Connections:   . Frequency of Communication with Friends and Family:   . Frequency of Social Gatherings with Friends and Family:   . Attends Religious Services:   . Active Member of Clubs or Organizations:   . Attends Banker Meetings:   Marland Kitchen Marital Status:   Intimate Partner Violence:   . Fear of Current or Ex-Partner:   . Emotionally Abused:   Marland Kitchen Physically Abused:   . Sexually Abused:    Social History   Tobacco Use  Smoking Status Never Smoker  Smokeless Tobacco Never Used   Social History    Substance and Sexual Activity  Alcohol Use No    Family History:  Family History  Problem Relation Age of Onset  . Anemia Mother        RAEB-1  . Other Mother        RAEB 1  . Heart attack Father   . Kidney disease Father   . Stroke Maternal Grandmother   . Diabetes Maternal Grandfather   . Breast cancer Cousin   . Thyroid cancer Maternal Uncle     Past medical history, surgical history, medications, allergies, family history and social history reviewed with patient today and changes made to appropriate areas of the chart.   Review of Systems  Constitutional: Negative.   HENT: Negative.   Eyes: Negative.   Respiratory: Negative.   Cardiovascular: Negative.   Gastrointestinal: Negative.   Genitourinary: Negative.   Musculoskeletal: Positive for joint pain. Negative for back pain, falls, myalgias and neck pain.  Skin: Negative.   Neurological: Negative.   Endo/Heme/Allergies: Negative.   Psychiatric/Behavioral: Negative.     All other ROS negative except what is listed above and in the HPI.      Objective:    BP (!) 176/82 (BP Location: Left Arm, Patient Position: Sitting, Cuff Size: Normal)   Pulse 75   Temp 98.5 F (36.9 C) (Oral)   Ht 5' 0.24" (1.53 m)   Wt 210 lb 12.8 oz (95.6 kg)   LMP  (LMP Unknown)   SpO2 99%   BMI 40.85 kg/m   Wt Readings from Last 3 Encounters:  02/23/20 210 lb 12.8 oz (95.6 kg)  02/10/20 218 lb 12.8 oz (99.2 kg)  12/17/18 204 lb 8 oz (92.8 kg)    Physical Exam Vitals and nursing note reviewed.  Constitutional:      General: She is not in acute distress.    Appearance: Normal appearance. She is not ill-appearing, toxic-appearing or diaphoretic.  HENT:     Head: Normocephalic and atraumatic.     Right Ear: Tympanic membrane, ear canal and external ear normal. There is no impacted cerumen.     Left Ear: Tympanic membrane, ear canal and external ear normal. There is no impacted cerumen.     Nose: Nose normal. No congestion or  rhinorrhea.     Mouth/Throat:     Mouth: Mucous membranes are moist.     Pharynx: Oropharynx is clear. No oropharyngeal exudate or posterior oropharyngeal erythema.  Eyes:     General: No scleral  icterus.       Right eye: No discharge.        Left eye: No discharge.     Extraocular Movements: Extraocular movements intact.     Conjunctiva/sclera: Conjunctivae normal.     Pupils: Pupils are equal, round, and reactive to light.  Neck:     Vascular: No carotid bruit.  Cardiovascular:     Rate and Rhythm: Normal rate and regular rhythm.     Pulses: Normal pulses.     Heart sounds: No murmur. No friction rub. No gallop.   Pulmonary:     Effort: Pulmonary effort is normal. No respiratory distress.     Breath sounds: Normal breath sounds. No stridor. No wheezing, rhonchi or rales.  Chest:     Chest wall: No tenderness.  Abdominal:     General: Abdomen is flat. Bowel sounds are normal. There is no distension.     Palpations: Abdomen is soft. There is no mass.     Tenderness: There is no abdominal tenderness. There is no right CVA tenderness, left CVA tenderness, guarding or rebound.     Hernia: No hernia is present.  Genitourinary:    Comments: Breast and pelvic exams deferred with shared decision making Musculoskeletal:        General: No swelling, tenderness, deformity or signs of injury.     Cervical back: Normal range of motion and neck supple. No rigidity. No muscular tenderness.     Right lower leg: No edema.     Left lower leg: No edema.  Lymphadenopathy:     Cervical: No cervical adenopathy.  Skin:    General: Skin is warm and dry.     Capillary Refill: Capillary refill takes less than 2 seconds.     Coloration: Skin is not jaundiced or pale.     Findings: No bruising, erythema, lesion or rash.  Neurological:     General: No focal deficit present.     Mental Status: She is alert and oriented to person, place, and time. Mental status is at baseline.     Cranial Nerves: No  cranial nerve deficit.     Sensory: No sensory deficit.     Motor: No weakness.     Coordination: Coordination normal.     Gait: Gait normal.     Deep Tendon Reflexes: Reflexes normal.  Psychiatric:        Mood and Affect: Mood normal.        Behavior: Behavior normal.        Thought Content: Thought content normal.        Judgment: Judgment normal.     Results for orders placed or performed in visit on 10/07/19  Novel Coronavirus, NAA ([facility])   Specimen: Nasopharyngeal(NP) swabs in vial transport medium   NASOPHARYNGE  TESTING  Result Value Ref Range   SARS-CoV-2, NAA Not Detected Not Detected      Assessment & Plan:   Problem List Items Addressed This Visit      Other   Hyperlipidemia    Rechecking labs today. Await results. Treat as needed.       Relevant Orders   Lipid Panel w/o Chol/HDL Ratio   Comprehensive metabolic panel    Other Visit Diagnoses    Routine general medical examination at a health care facility    -  Primary   Vaccines up to date. Screening labs checked today. Mammogram ordered today. Pap up to date. Cologuard on hold. Continue diet and exercise. Call with  concerns.    Chronic pain of right knee       Steroid shot given today as below.    Encounter for screening mammogram for malignant neoplasm of breast       Mammogram ordered today.   Relevant Orders   MM 3D SCREEN BREAST BILATERAL       Follow up plan: Return in about 1 year (around 02/22/2021), or Physical.  Procedure: Right  Knee Intraarticular Steroid Injection        Diagnosis:   ICD-10-CM   1. Routine general medical examination at a health care facility  Z00.00    Vaccines up to date. Screening labs checked today. Mammogram ordered today. Pap up to date. Cologuard on hold. Continue diet and exercise. Call with concerns.   2. Mixed hyperlipidemia  E78.2 Lipid Panel w/o Chol/HDL Ratio    Comprehensive metabolic panel  3. Chronic pain of right knee  M25.561    G89.29    Steroid  shot given today as below.   4. Encounter for screening mammogram for malignant neoplasm of breast  Z12.31 MM 3D SCREEN BREAST BILATERAL   Mammogram ordered today.    Physician: MJ Consent:  Risks, benefits, and alternative treatments discussed and all questions were answered.  Patient elected to proceed and verbal consent obtained.  Description: Area prepped and draped using  semi-sterile technique.  Using a anterior/lateral approach, a mixture of 4 cc of  1% lidocaine & 1 cc of Kenalog 40 was injected into knee joint.  A bandage was then placed over the injection site. Complications: none Post Procedure Instructions: Wound care instructions discussed and patient was instructed to keep area clean and dry.  Signs and symptoms of infection discussed, patient agrees to contact the office ASAP should they occur.   LABORATORY TESTING:  - Pap smear: up to date  IMMUNIZATIONS:   - Tdap: Tetanus vaccination status reviewed: last tetanus booster within 10 years. - Influenza: Postponed to flu season - Pneumovax: Not applicable  SCREENING: -Mammogram: Ordered today  - Colonoscopy: on hold due to insurance   PATIENT COUNSELING:   Advised to take 1 mg of folate supplement per day if capable of pregnancy.   Sexuality: Discussed sexually transmitted diseases, partner selection, use of condoms, avoidance of unintended pregnancy  and contraceptive alternatives.   Advised to avoid cigarette smoking.  I discussed with the patient that most people either abstain from alcohol or drink within safe limits (<=14/week and <=4 drinks/occasion for males, <=7/weeks and <= 3 drinks/occasion for females) and that the risk for alcohol disorders and other health effects rises proportionally with the number of drinks per week and how often a drinker exceeds daily limits.  Discussed cessation/primary prevention of drug use and availability of treatment for abuse.   Diet: Encouraged to adjust caloric intake to  maintain  or achieve ideal body weight, to reduce intake of dietary saturated fat and total fat, to limit sodium intake by avoiding high sodium foods and not adding table salt, and to maintain adequate dietary potassium and calcium preferably from fresh fruits, vegetables, and low-fat dairy products.    stressed the importance of regular exercise  Injury prevention: Discussed safety belts, safety helmets, smoke detector, smoking near bedding or upholstery.   Dental health: Discussed importance of regular tooth brushing, flossing, and dental visits.    NEXT PREVENTATIVE PHYSICAL DUE IN 1 YEAR. Return in about 1 year (around 02/22/2021), or Physical.

## 2020-02-24 ENCOUNTER — Encounter: Payer: Self-pay | Admitting: Family Medicine

## 2020-02-24 LAB — COMPREHENSIVE METABOLIC PANEL
ALT: 44 IU/L — ABNORMAL HIGH (ref 0–32)
AST: 36 IU/L (ref 0–40)
Albumin/Globulin Ratio: 1.6 (ref 1.2–2.2)
Albumin: 4.6 g/dL (ref 3.8–4.9)
Alkaline Phosphatase: 53 IU/L (ref 39–117)
BUN/Creatinine Ratio: 27 — ABNORMAL HIGH (ref 9–23)
BUN: 21 mg/dL (ref 6–24)
Bilirubin Total: 0.2 mg/dL (ref 0.0–1.2)
CO2: 24 mmol/L (ref 20–29)
Calcium: 9.3 mg/dL (ref 8.7–10.2)
Chloride: 101 mmol/L (ref 96–106)
Creatinine, Ser: 0.77 mg/dL (ref 0.57–1.00)
GFR calc Af Amer: 101 mL/min/{1.73_m2} (ref >59)
GFR calc non Af Amer: 87 mL/min/{1.73_m2} (ref >59)
Globulin, Total: 2.8 g/dL (ref 1.5–4.5)
Glucose: 98 mg/dL (ref 65–99)
Potassium: 4.1 mmol/L (ref 3.5–5.2)
Sodium: 139 mmol/L (ref 134–144)
Total Protein: 7.4 g/dL (ref 6.0–8.5)

## 2020-02-24 LAB — LIPID PANEL W/O CHOL/HDL RATIO
Cholesterol, Total: 204 mg/dL — ABNORMAL HIGH (ref 100–199)
HDL: 40 mg/dL (ref >39)
LDL Chol Calc (NIH): 139 mg/dL — ABNORMAL HIGH (ref 0–99)
Triglycerides: 140 mg/dL (ref 0–149)
VLDL Cholesterol Cal: 25 mg/dL (ref 5–40)

## 2020-03-08 ENCOUNTER — Encounter: Payer: Self-pay | Admitting: Family Medicine

## 2020-05-18 ENCOUNTER — Other Ambulatory Visit: Payer: Self-pay

## 2020-05-18 ENCOUNTER — Encounter: Payer: Self-pay | Admitting: *Deleted

## 2020-05-18 ENCOUNTER — Ambulatory Visit: Payer: Self-pay | Attending: Oncology | Admitting: *Deleted

## 2020-05-18 ENCOUNTER — Ambulatory Visit
Admission: RE | Admit: 2020-05-18 | Discharge: 2020-05-18 | Disposition: A | Discharge status: Home / Self Care | Payer: Self-pay | Source: Ambulatory Visit | Attending: Oncology | Admitting: Oncology

## 2020-05-18 VITALS — BP 151/66 | HR 65 | Temp 97.8°F | Resp 20 | Ht 60.0 in | Wt 218.6 lb

## 2020-05-18 DIAGNOSIS — Z0000 Encounter for general adult medical examination without abnormal findings: Secondary | ICD-10-CM

## 2020-05-18 NOTE — Patient Instructions (Signed)
Gave patient hand-out, Women Staying Healthy, Active and Well from [facility], with education on breast health, pap smears, heart and colon health. 

## 2020-05-18 NOTE — Progress Notes (Signed)
  Subjective:     Patient ID: Marissa Reynolds, female   DOB: 09/19/1966, 54 y.o.   MRN: [id number]  HPI   [facility] Medical History Record - 05/18/20 4536      Breast History   Screening cycle Rescreen    CBE Date 05/18/20    Provider ([facility]) [facility]    Last Mammogram Annual    Last Mammogram Date 01/16/17    Recent Breast Symptoms None      Breast Cancer History   Breast Cancer History No personal or family history      Previous History of Breast Problems   Breast Surgery or Biopsy None    Breast Implants N/A    BSE Done Never      Gynecological/Obstetrical History   Age at menopause 68    PAP smear history Annually    Date of last PAP  11/02/17    Breast fed children No    DES Exposure No    Cervical, Uterine or Ovarian cancer No    Family history of Cervial, Uterine or Ovarian cancer No    Hysterectomy No    Cervix removed No    Ovaries removed No    Laser/Cryosurgery No    Current method of birth control None    Current method of Estrogen/Hormone replacement None    Smoking history None             Review of Systems     Objective:   Physical Exam Chest:     Breasts: Breasts are asymmetrical.        Right: No swelling, bleeding, inverted nipple, mass, nipple discharge, skin change or tenderness.        Left: No swelling, bleeding, inverted nipple, mass, nipple discharge, skin change or tenderness.       Comments: Right breast larger than the left Lymphadenopathy:     Upper Body:     Right upper body: No supraclavicular or axillary adenopathy.     Left upper body: No supraclavicular or axillary adenopathy.        Assessment:     54 year old female presents to [facility] for clinical breast exam and mammogram.  Clinical breast unremarkable.  Taught self breast awareness.  Last pap smear on 11/02/17 was negative without HPV co-testing.  Next pap due in 2022.  Patient has been screened for eligibility.  She does not have any insurance, Medicare or Medicaid.  She also  meets financial eligibility.   Risk Assessment    Risk Scores      05/18/2020   Last edited by: Neita Garnet, CMA   5-year risk: 1.3 %   Lifetime risk: 9.1 %            Plan:     Screening mammogram ordered.  Will follow up per [facility] protocol.

## 2020-05-20 ENCOUNTER — Encounter: Payer: Self-pay | Admitting: *Deleted

## 2020-05-20 NOTE — Progress Notes (Signed)
Letter mailed from the [facility] to inform patient of her normal mammogram results.  Patient is to follow-up with annual screening in one year. 

## 2021-05-26 ENCOUNTER — Ambulatory Visit (INDEPENDENT_AMBULATORY_CARE_PROVIDER_SITE_OTHER): Payer: Self-pay | Admitting: Family Medicine

## 2021-05-26 ENCOUNTER — Other Ambulatory Visit: Payer: Self-pay

## 2021-05-26 ENCOUNTER — Encounter: Payer: Self-pay | Admitting: Family Medicine

## 2021-05-26 VITALS — BP 142/78 | HR 78 | Temp 98.2°F | Wt 215.8 lb

## 2021-05-26 DIAGNOSIS — H66003 Acute suppurative otitis media without spontaneous rupture of ear drum, bilateral: Secondary | ICD-10-CM

## 2021-05-26 MED ORDER — PREDNISONE 50 MG PO TABS: 50.0000 mg | ORAL_TABLET | Freq: Every day | ORAL | 0 refills | Status: DC

## 2021-05-26 MED ORDER — AMOXICILLIN-POT CLAVULANATE 875-125 MG PO TABS
1.0000 | ORAL_TABLET | Freq: Two times a day (BID) | ORAL | 0 refills | Status: DC
Start: 2021-05-26 — End: 2021-09-07

## 2021-05-26 NOTE — Progress Notes (Signed)
BP (!) 142/78 (BP Location: Left Arm, Cuff Size: Large)   Pulse 78   Temp 98.2 F (36.8 C) (Oral)   Wt 215 lb 12.8 oz (97.9 kg)   LMP  (LMP Unknown)   SpO2 96%   BMI 42.15 kg/m    Subjective:    Patient ID: Marissa Reynolds, female    DOB: 16-Aug-1966, 55 y.o.   MRN: [id number]  HPI: Marissa Reynolds is a 55 y.o. female  Chief Complaint  Patient presents with   Tinnitus    Pt states she has been having ringing in her ears, bilateral. States it started a few days ago and has gotten some better   TINNITUS Duration: about a month Description of tinnitus: high pitched ringing Pulsatile: no Tinnitus duration: continuous Episode frequency: continous Severity: mild Head injury: no Chronic exposure to loud noises: no Exposure to ototoxic medications: no Vertigo:no Hearing loss: yes Aural fullness: yes Headache:yes- resolved now  TMJ syndrome symptoms: no Unsteady gait: no Postural instability: no Diplopia, dysarthria, dysphagia or weakness: no Anxietydepression: no  Relevant past medical, surgical, family and social history reviewed and updated as indicated. Interim medical history since our last visit reviewed. Allergies and medications reviewed and updated.  Review of Systems  Constitutional: Negative.   HENT:  Positive for ear pain and hearing loss. Negative for congestion, dental problem, drooling, ear discharge, facial swelling, mouth sores, nosebleeds, postnasal drip, rhinorrhea, sinus pressure, sinus pain, sneezing, sore throat, tinnitus, trouble swallowing and voice change.   Respiratory: Negative.    Cardiovascular: Negative.   Musculoskeletal: Negative.   Psychiatric/Behavioral: Negative.     Per HPI unless specifically indicated above     Objective:    BP (!) 142/78 (BP Location: Left Arm, Cuff Size: Large)   Pulse 78   Temp 98.2 F (36.8 C) (Oral)   Wt 215 lb 12.8 oz (97.9 kg)   LMP  (LMP Unknown)   SpO2 96%   BMI 42.15 kg/m   Wt Readings from Last 3  Encounters:  05/26/21 215 lb 12.8 oz (97.9 kg)  05/18/20 218 lb 9.6 oz (99.2 kg)  02/23/20 210 lb 12.8 oz (95.6 kg)    Physical Exam Vitals and nursing note reviewed.  Constitutional:      General: She is not in acute distress.    Appearance: Normal appearance. She is not ill-appearing, toxic-appearing or diaphoretic.  HENT:     Head: Normocephalic and atraumatic.     Right Ear: External ear normal.     Left Ear: External ear normal.     Nose: Nose normal.     Mouth/Throat:     Mouth: Mucous membranes are moist.     Pharynx: Oropharynx is clear.  Eyes:     General: No scleral icterus.       Right eye: No discharge.        Left eye: No discharge.     Extraocular Movements: Extraocular movements intact.     Conjunctiva/sclera: Conjunctivae normal.     Pupils: Pupils are equal, round, and reactive to light.  Cardiovascular:     Rate and Rhythm: Normal rate and regular rhythm.     Pulses: Normal pulses.     Heart sounds: Normal heart sounds. No murmur heard.   No friction rub. No gallop.  Pulmonary:     Effort: Pulmonary effort is normal. No respiratory distress.     Breath sounds: Normal breath sounds. No stridor. No wheezing, rhonchi or rales.  Chest:  Chest wall: No tenderness.  Musculoskeletal:        General: Normal range of motion.     Cervical back: Normal range of motion and neck supple.  Skin:    General: Skin is warm and dry.     Capillary Refill: Capillary refill takes less than 2 seconds.     Coloration: Skin is not jaundiced or pale.     Findings: No bruising, erythema, lesion or rash.  Neurological:     General: No focal deficit present.     Mental Status: She is alert and oriented to person, place, and time. Mental status is at baseline.  Psychiatric:        Mood and Affect: Mood normal.        Behavior: Behavior normal.        Thought Content: Thought content normal.        Judgment: Judgment normal.    Results for orders placed or performed in  visit on 02/23/20  Lipid Panel w/o Chol/HDL Ratio  Result Value Ref Range   Cholesterol, Total 204 (H) 100 - 199 mg/dL   Triglycerides 546 0 - 149 mg/dL   HDL 40 >27 mg/dL   VLDL Cholesterol Cal 25 5 - 40 mg/dL   LDL Chol Calc (NIH) 035 (H) 0 - 99 mg/dL  Comprehensive metabolic panel  Result Value Ref Range   Glucose 98 65 - 99 mg/dL   BUN 21 6 - 24 mg/dL   Creatinine, Ser 0.09 0.57 - 1.00 mg/dL   GFR calc non Af Amer 87 >59 mL/min/1.73   GFR calc Af Amer 101 >59 mL/min/1.73   BUN/Creatinine Ratio 27 (H) 9 - 23   Sodium 139 134 - 144 mmol/L   Potassium 4.1 3.5 - 5.2 mmol/L   Chloride 101 96 - 106 mmol/L   CO2 24 20 - 29 mmol/L   Calcium 9.3 8.7 - 10.2 mg/dL   Total Protein 7.4 6.0 - 8.5 g/dL   Albumin 4.6 3.8 - 4.9 g/dL   Globulin, Total 2.8 1.5 - 4.5 g/dL   Albumin/Globulin Ratio 1.6 1.2 - 2.2   Bilirubin Total 0.2 0.0 - 1.2 mg/dL   Alkaline Phosphatase 53 39 - 117 IU/L   AST 36 0 - 40 IU/L   ALT 44 (H) 0 - 32 IU/L      Assessment & Plan:   Problem List Items Addressed This Visit   None Visit Diagnoses     Non-recurrent acute suppurative otitis media of both ears without spontaneous rupture of tympanic membranes    -  Primary   Will treat with prednisone and augmentin. Call with any concerns or if not getting better.    Relevant Medications   amoxicillin-clavulanate (AUGMENTIN) 875-125 MG tablet        Follow up plan: Return ASAP Physical.

## 2021-07-21 ENCOUNTER — Encounter: Payer: Self-pay | Admitting: Family Medicine

## 2021-09-07 ENCOUNTER — Encounter: Payer: Self-pay | Admitting: Family Medicine

## 2021-09-07 ENCOUNTER — Telehealth (INDEPENDENT_AMBULATORY_CARE_PROVIDER_SITE_OTHER): Payer: BC Managed Care – PPO | Admitting: Family Medicine

## 2021-09-07 DIAGNOSIS — J0100 Acute maxillary sinusitis, unspecified: Secondary | ICD-10-CM

## 2021-09-07 MED ORDER — PREDNISONE 10 MG PO TABS
ORAL_TABLET | ORAL | 0 refills | Status: DC
Start: 2021-09-07 — End: 2021-12-20

## 2021-09-07 MED ORDER — AMOXICILLIN-POT CLAVULANATE 875-125 MG PO TABS
1.0000 | ORAL_TABLET | Freq: Two times a day (BID) | ORAL | 0 refills | Status: DC
Start: 2021-09-07 — End: 2021-12-20

## 2021-09-07 NOTE — Telephone Encounter (Signed)
OK to double book virtual at 10:20 or 11AM

## 2021-09-07 NOTE — Progress Notes (Signed)
LMP  (LMP Unknown)    Subjective:    Patient ID: Marissa Reynolds, female    DOB: [birth year], 55 y.o.   MRN: [id number]  HPI: Zarinah Oviatt is a 55 y.o. female  Chief Complaint  Patient presents with   Laryngitis   UPPER RESPIRATORY TRACT INFECTION Duration: 4 weeks Worst symptom: laryngitis Fever: no Cough: no Shortness of breath: no Wheezing: no Chest pain: yes, with cough Chest tightness: no Chest congestion: no Nasal congestion: no Runny nose: yes Post nasal drip: no Sneezing: no Sore throat: no Swollen glands: no Sinus pressure: yes Headache: no Face pain: no Toothache: no Ear pain: no  Ear pressure: no  Eyes red/itching:no Eye drainage/crusting: no  Vomiting: no Rash: no Fatigue: no Sick contacts: no Strep contacts: no  Context: stable Recurrent sinusitis: no Relief with OTC cold/cough medications: no  Treatments attempted: cold/sinus and pseudoephedrine   Relevant past medical, surgical, family and social history reviewed and updated as indicated. Interim medical history since our last visit reviewed. Allergies and medications reviewed and updated.  Review of Systems  Constitutional: Negative.   HENT:  Positive for congestion, sore throat and voice change. Negative for dental problem, drooling, ear discharge, ear pain, facial swelling, hearing loss, mouth sores, nosebleeds, postnasal drip, rhinorrhea, sinus pressure, sinus pain, sneezing, tinnitus and trouble swallowing.   Respiratory:  Positive for cough. Negative for apnea, choking, chest tightness, shortness of breath, wheezing and stridor.   Cardiovascular: Negative.   Gastrointestinal: Negative.   Musculoskeletal: Negative.   Psychiatric/Behavioral: Negative.     Per HPI unless specifically indicated above     Objective:    LMP  (LMP Unknown)   Wt Readings from Last 3 Encounters:  05/26/21 215 lb 12.8 oz (97.9 kg)  05/18/20 218 lb 9.6 oz (99.2 kg)  02/23/20 210 lb 12.8 oz (95.6 kg)     Physical Exam Vitals and nursing note reviewed.  Constitutional:      General: She is not in acute distress.    Appearance: Normal appearance. She is not ill-appearing, toxic-appearing or diaphoretic.  HENT:     Head: Normocephalic and atraumatic.     Right Ear: External ear normal.     Left Ear: External ear normal.     Nose: Nose normal.     Mouth/Throat:     Mouth: Mucous membranes are moist.     Pharynx: Oropharynx is clear.     Comments: +Laryngitis Eyes:     General: No scleral icterus.       Right eye: No discharge.        Left eye: No discharge.     Conjunctiva/sclera: Conjunctivae normal.     Pupils: Pupils are equal, round, and reactive to light.  Pulmonary:     Effort: Pulmonary effort is normal. No respiratory distress.     Comments: Speaking in full sentences Musculoskeletal:        General: Normal range of motion.     Cervical back: Normal range of motion.  Skin:    Coloration: Skin is not jaundiced or pale.     Findings: No bruising, erythema, lesion or rash.  Neurological:     Mental Status: She is alert and oriented to person, place, and time. Mental status is at baseline.  Psychiatric:        Mood and Affect: Mood normal.        Behavior: Behavior normal.        Thought Content: Thought content normal.  Judgment: Judgment normal.    Results for orders placed or performed in visit on 02/23/20  Lipid Panel w/o Chol/HDL Ratio  Result Value Ref Range   Cholesterol, Total 204 (H) 100 - 199 mg/dL   Triglycerides 427 0 - 149 mg/dL   HDL 40 >06 mg/dL   VLDL Cholesterol Cal 25 5 - 40 mg/dL   LDL Chol Calc (NIH) 237 (H) 0 - 99 mg/dL  Comprehensive metabolic panel  Result Value Ref Range   Glucose 98 65 - 99 mg/dL   BUN 21 6 - 24 mg/dL   Creatinine, Ser 6.28 0.57 - 1.00 mg/dL   GFR calc non Af Amer 87 >59 mL/min/1.73   GFR calc Af Amer 101 >59 mL/min/1.73   BUN/Creatinine Ratio 27 (H) 9 - 23   Sodium 139 134 - 144 mmol/L   Potassium 4.1 3.5 - 5.2  mmol/L   Chloride 101 96 - 106 mmol/L   CO2 24 20 - 29 mmol/L   Calcium 9.3 8.7 - 10.2 mg/dL   Total Protein 7.4 6.0 - 8.5 g/dL   Albumin 4.6 3.8 - 4.9 g/dL   Globulin, Total 2.8 1.5 - 4.5 g/dL   Albumin/Globulin Ratio 1.6 1.2 - 2.2   Bilirubin Total 0.2 0.0 - 1.2 mg/dL   Alkaline Phosphatase 53 39 - 117 IU/L   AST 36 0 - 40 IU/L   ALT 44 (H) 0 - 32 IU/L      Assessment & Plan:   Problem List Items Addressed This Visit   None Visit Diagnoses     Acute non-recurrent maxillary sinusitis    -  Primary   Will treat with augmentin and prednisone. Call with any concerns or if not getting better. Continue to monitor.    Relevant Medications   predniSONE (DELTASONE) 10 MG tablet   amoxicillin-clavulanate (AUGMENTIN) 875-125 MG tablet        Follow up plan: Return if symptoms worsen or fail to improve.   This visit was completed via video visit through MyChart due to the restrictions of the COVID-19 pandemic. All issues as above were discussed and addressed. Physical exam was done as above through visual confirmation on video through MyChart. If it was felt that the patient should be evaluated in the office, they were directed there. The patient verbally consented to this visit. Location of the patient: home Location of the provider: work Those involved with this call:  Provider: Olevia Perches, DO CMA: Rolley Sims, CMA Front Desk/Registration: [employer]  Time spent on call:  15 minutes with patient face to face via video conference. More than 50% of this time was spent in counseling and coordination of care. 23 minutes total spent in review of patient's record and preparation of their chart.

## 2021-09-07 NOTE — Telephone Encounter (Signed)
Virtual appointment scheduled at 11am today.

## 2021-12-16 ENCOUNTER — Encounter: Payer: Self-pay | Admitting: Family Medicine

## 2021-12-20 ENCOUNTER — Encounter: Payer: Self-pay | Admitting: Family Medicine

## 2021-12-20 ENCOUNTER — Ambulatory Visit: Payer: BC Managed Care – PPO | Admitting: Family Medicine

## 2021-12-20 ENCOUNTER — Ambulatory Visit (INDEPENDENT_AMBULATORY_CARE_PROVIDER_SITE_OTHER): Payer: BC Managed Care – PPO | Admitting: Family Medicine

## 2021-12-20 ENCOUNTER — Other Ambulatory Visit: Payer: Self-pay

## 2021-12-20 VITALS — BP 123/71 | HR 80 | Temp 98.9°F | Ht 59.5 in | Wt 217.2 lb

## 2021-12-20 DIAGNOSIS — Z0000 Encounter for general adult medical examination without abnormal findings: Secondary | ICD-10-CM

## 2021-12-20 DIAGNOSIS — J4520 Mild intermittent asthma, uncomplicated: Secondary | ICD-10-CM

## 2021-12-20 DIAGNOSIS — N951 Menopausal and female climacteric states: Secondary | ICD-10-CM | POA: Diagnosis not present

## 2021-12-20 DIAGNOSIS — H9313 Tinnitus, bilateral: Secondary | ICD-10-CM | POA: Diagnosis not present

## 2021-12-20 DIAGNOSIS — E782 Mixed hyperlipidemia: Secondary | ICD-10-CM

## 2021-12-20 DIAGNOSIS — H9191 Unspecified hearing loss, right ear: Secondary | ICD-10-CM | POA: Diagnosis not present

## 2021-12-20 DIAGNOSIS — Z1231 Encounter for screening mammogram for malignant neoplasm of breast: Secondary | ICD-10-CM

## 2021-12-20 LAB — URINALYSIS, ROUTINE W REFLEX MICROSCOPIC
Bilirubin, UA: NEGATIVE
Glucose, UA: NEGATIVE
Ketones, UA: NEGATIVE
Leukocytes,UA: NEGATIVE
Nitrite, UA: NEGATIVE
Protein,UA: NEGATIVE
RBC, UA: NEGATIVE
Specific Gravity, UA: 1.015 (ref 1.005–1.030)
Urobilinogen, Ur: 0.2 mg/dL (ref 0.2–1.0)
pH, UA: 8 — ABNORMAL HIGH (ref 5.0–7.5)

## 2021-12-20 NOTE — Assessment & Plan Note (Signed)
Will check on labs. Consider premarin.  ?

## 2021-12-20 NOTE — Assessment & Plan Note (Addendum)
Stable. Following with pulmonology. Call with any concerns. Continue to monitor.  ? ?

## 2021-12-20 NOTE — Progress Notes (Signed)
BP 123/71    Pulse 80    Temp 98.9 F (37.2 C)    Ht 4' 11.5" (1.511 m)    Wt 217 lb 3.2 oz (98.5 kg)    LMP  (LMP Unknown)    SpO2 98%    BMI 43.13 kg/m    Subjective:    Patient ID: Marissa Reynolds, female    DOB: 1966/04/03, 56 y.o.   MRN: [id number]  HPI: Marissa Reynolds is a 56 y.o. female presenting on 12/20/2021 for comprehensive medical examination. Current medical complaints include:  Hearing in her R ear is not great. Has been having ringing in both ears. Has been having some hoarseness and sore throat. Had some swelling of her uuvula.  WEIGHT GAIN Duration: chronic Previous attempts at weight loss: yes Complications of obesity: HLD Peak weight: current Weight loss goal: 30lbs Weight loss to date: none Requesting obesity pharmacotherapy: unsure Current weight loss supplements/medications: no Previous weight loss supplements/meds: no  She currently lives with: husband Menopausal Symptoms: no  Depression Screen done today and results listed below:  Depression screen [facility] 2/9 12/20/2021 05/26/2021 02/10/2020 12/17/2018 05/01/2017  Decreased Interest 0 1 0 0 0  Down, Depressed, Hopeless 0 1 0 0 0  PHQ - 2 Score 0 2 0 0 0  Altered sleeping 1 3 2 1  -  Tired, decreased energy 1 1 1 1  -  Change in appetite 1 0 3 2 -  Feeling bad or failure about yourself  0 0 0 0 -  Trouble concentrating 0 0 0 0 -  Moving slowly or fidgety/restless 0 0 0 0 -  Suicidal thoughts 0 0 0 0 -  PHQ-9 Score 3 6 6 4  -  Difficult doing work/chores - Not difficult at all Not difficult at all Not difficult at all -    Past Medical History:  Past Medical History:  Diagnosis Date   Asthma     Surgical History:  Past Surgical History:  Procedure Laterality Date   CESAREAN SECTION      Medications:  Current Outpatient Medications on File Prior to Visit  Medication Sig   albuterol (PROVENTIL HFA;VENTOLIN HFA) 108 (90 BASE) MCG/ACT inhaler Inhale 2 puffs into the lungs every 6 (six) hours as needed for  wheezing or shortness of breath.   Biotin 1000 MCG tablet Take 1,000 mcg by mouth 3 (three) times daily.   ferrous sulfate 325 (65 FE) MG tablet Take 325 mg by mouth daily with breakfast.   fluticasone-salmeterol (ADVAIR HFA) 115-21 MCG/ACT inhaler Inhale into the lungs.   glucosamine-chondroitin 500-400 MG tablet Take 1 tablet by mouth daily.   loratadine (CLARITIN) 10 MG tablet Take 10 mg by mouth daily.   melatonin 5 MG TABS Take 15 mg by mouth at bedtime.   Multiple Vitamin (MULTIVITAMIN) tablet Take 1 tablet by mouth daily.   naproxen sodium (ALEVE) 220 MG tablet Take 220 mg by mouth daily as needed (2 tabs daily).   OVER THE COUNTER MEDICATION Vitamin B 6   Potassium 75 MG TABS Take by mouth.   No current facility-administered medications on file prior to visit.    Allergies:  No Known Allergies  Social History:  Social History   Socioeconomic History   Marital status: Married    Spouse name: Not on file   Number of children: Not on file   Years of education: Not on file   Highest education level: Not on file  Occupational History   Not on file  Tobacco Use   Smoking status: Never   Smokeless tobacco: Never  Vaping Use   Vaping Use: Never used  Substance and Sexual Activity   Alcohol use: No   Drug use: No   Sexual activity: Yes    Birth control/protection: Post-menopausal  Other Topics Concern   Not on file  Social History Narrative   Not on file   Social Determinants of Health   Financial Resource Strain: Not on file  Food Insecurity: Not on file  Transportation Needs: Not on file  Physical Activity: Not on file  Stress: Not on file  Social Connections: Not on file  Intimate Partner Violence: Not on file   Social History   Tobacco Use  Smoking Status Never  Smokeless Tobacco Never   Social History   Substance and Sexual Activity  Alcohol Use No    Family History:  Family History  Problem Relation Age of Onset   Anemia Mother        RAEB-1    Other Mother        RAEB 1   Heart attack Father    Kidney disease Father    Stroke Maternal Grandmother    Diabetes Maternal Grandfather    Breast cancer Cousin    Thyroid cancer Maternal Uncle     Past medical history, surgical history, medications, allergies, family history and social history reviewed with patient today and changes made to appropriate areas of the chart.   Review of Systems  Constitutional: Negative.   HENT:  Positive for hearing loss, sore throat and tinnitus. Negative for congestion, ear discharge, ear pain, nosebleeds and sinus pain.   Eyes: Negative.   Respiratory: Negative.  Negative for stridor.   Cardiovascular: Negative.   Gastrointestinal:  Positive for diarrhea. Negative for abdominal pain, blood in stool, constipation, heartburn, melena, nausea and vomiting.  Genitourinary: Negative.   Musculoskeletal: Negative.   Skin: Negative.   Neurological: Negative.   Endo/Heme/Allergies:  Positive for environmental allergies. Negative for polydipsia. Does not bruise/bleed easily.  Psychiatric/Behavioral: Negative.    All other ROS negative except what is listed above and in the HPI.      Objective:    BP 123/71    Pulse 80    Temp 98.9 F (37.2 C)    Ht 4' 11.5" (1.511 m)    Wt 217 lb 3.2 oz (98.5 kg)    LMP  (LMP Unknown)    SpO2 98%    BMI 43.13 kg/m   Wt Readings from Last 3 Encounters:  12/20/21 217 lb 3.2 oz (98.5 kg)  05/26/21 215 lb 12.8 oz (97.9 kg)  05/18/20 218 lb 9.6 oz (99.2 kg)    Physical Exam Vitals and nursing note reviewed.  Constitutional:      General: She is not in acute distress.    Appearance: Normal appearance. She is not ill-appearing, toxic-appearing or diaphoretic.  HENT:     Head: Normocephalic and atraumatic.     Right Ear: Tympanic membrane, ear canal and external ear normal. There is no impacted cerumen.     Left Ear: Tympanic membrane, ear canal and external ear normal. There is no impacted cerumen.     Nose: Nose  normal. No congestion or rhinorrhea.     Mouth/Throat:     Mouth: Mucous membranes are moist.     Pharynx: Oropharynx is clear. No oropharyngeal exudate or posterior oropharyngeal erythema.  Eyes:     General: No scleral icterus.  Right eye: No discharge.        Left eye: No discharge.     Extraocular Movements: Extraocular movements intact.     Conjunctiva/sclera: Conjunctivae normal.     Pupils: Pupils are equal, round, and reactive to light.  Neck:     Vascular: No carotid bruit.  Cardiovascular:     Rate and Rhythm: Normal rate and regular rhythm.     Pulses: Normal pulses.     Heart sounds: No murmur heard.   No friction rub. No gallop.  Pulmonary:     Effort: Pulmonary effort is normal. No respiratory distress.     Breath sounds: Normal breath sounds. No stridor. No wheezing, rhonchi or rales.  Chest:     Chest wall: No tenderness.  Abdominal:     General: Abdomen is flat. Bowel sounds are normal. There is no distension.     Palpations: Abdomen is soft. There is no mass.     Tenderness: There is no abdominal tenderness. There is no right CVA tenderness, left CVA tenderness, guarding or rebound.     Hernia: No hernia is present.  Genitourinary:    Comments: Breast and pelvic exams deferred with shared decision making Musculoskeletal:        General: No swelling, tenderness, deformity or signs of injury.     Cervical back: Normal range of motion and neck supple. No rigidity. No muscular tenderness.     Right lower leg: No edema.     Left lower leg: No edema.  Lymphadenopathy:     Cervical: No cervical adenopathy.  Skin:    General: Skin is warm and dry.     Capillary Refill: Capillary refill takes less than 2 seconds.     Coloration: Skin is not jaundiced or pale.     Findings: No bruising, erythema, lesion or rash.  Neurological:     General: No focal deficit present.     Mental Status: She is alert and oriented to person, place, and time. Mental status is at  baseline.     Cranial Nerves: No cranial nerve deficit.     Sensory: No sensory deficit.     Motor: No weakness.     Coordination: Coordination normal.     Gait: Gait normal.     Deep Tendon Reflexes: Reflexes normal.  Psychiatric:        Mood and Affect: Mood normal.        Behavior: Behavior normal.        Thought Content: Thought content normal.        Judgment: Judgment normal.    Results for orders placed or performed in visit on 12/20/21  CBC with Differential/Platelet  Result Value Ref Range   WBC 8.6 3.4 - 10.8 x10E3/uL   RBC 4.48 3.77 - 5.28 x10E6/uL   Hemoglobin 13.8 11.1 - 15.9 g/dL   Hematocrit 41.2 34.0 - 46.6 %   MCV 92 79 - 97 fL   MCH 30.8 26.6 - 33.0 pg   MCHC 33.5 31.5 - 35.7 g/dL   RDW 12.9 11.7 - 15.4 %   Platelets 283 150 - 450 x10E3/uL   Neutrophils 59 Not Estab. %   Lymphs 28 Not Estab. %   Monocytes 8 Not Estab. %   Eos 3 Not Estab. %   Basos 1 Not Estab. %   Neutrophils Absolute 5.0 1.4 - 7.0 x10E3/uL   Lymphocytes Absolute 2.4 0.7 - 3.1 x10E3/uL   Monocytes Absolute 0.7 0.1 - 0.9 x10E3/uL  EOS (ABSOLUTE) 0.3 0.0 - 0.4 x10E3/uL   Basophils Absolute 0.1 0.0 - 0.2 x10E3/uL   Immature Granulocytes 1 Not Estab. %   Immature Grans (Abs) 0.1 0.0 - 0.1 x10E3/uL  Comprehensive metabolic panel  Result Value Ref Range   Glucose 90 70 - 99 mg/dL   BUN 16 6 - 24 mg/dL   Creatinine, Ser 0.63 0.57 - 1.00 mg/dL   eGFR 103 >59 mL/min/1.73   BUN/Creatinine Ratio 25 (H) 9 - 23   Sodium 142 134 - 144 mmol/L   Potassium 4.0 3.5 - 5.2 mmol/L   Chloride 101 96 - 106 mmol/L   CO2 25 20 - 29 mmol/L   Calcium 9.2 8.7 - 10.2 mg/dL   Total Protein 6.6 6.0 - 8.5 g/dL   Albumin 4.3 3.8 - 4.9 g/dL   Globulin, Total 2.3 1.5 - 4.5 g/dL   Albumin/Globulin Ratio 1.9 1.2 - 2.2   Bilirubin Total <0.2 0.0 - 1.2 mg/dL   Alkaline Phosphatase 42 (L) 44 - 121 IU/L   AST 36 0 - 40 IU/L   ALT 42 (H) 0 - 32 IU/L  Lipid Panel w/o Chol/HDL Ratio  Result Value Ref Range    Cholesterol, Total 252 (H) 100 - 199 mg/dL   Triglycerides 336 (H) 0 - 149 mg/dL   HDL 55 >39 mg/dL   VLDL Cholesterol Cal 60 (H) 5 - 40 mg/dL   LDL Chol Calc (NIH) 137 (H) 0 - 99 mg/dL  Urinalysis, Routine w reflex microscopic  Result Value Ref Range   Specific Gravity, UA 1.015 1.005 - 1.030   pH, UA 8.0 (H) 5.0 - 7.5   Color, UA Yellow Yellow   Appearance Ur Clear Clear   Leukocytes,UA Negative Negative   Protein,UA Negative Negative/Trace   Glucose, UA Negative Negative   Ketones, UA Negative Negative   RBC, UA Negative Negative   Bilirubin, UA Negative Negative   Urobilinogen, Ur 0.2 0.2 - 1.0 mg/dL   Nitrite, UA Negative Negative  TSH  Result Value Ref Range   TSH 4.280 0.450 - 4.500 uIU/mL  LH  Result Value Ref Range   LH 29.5 mIU/mL  FSH  Result Value Ref Range   FSH 45.9 mIU/mL  Estradiol  Result Value Ref Range   Estradiol <5.0 pg/mL      Assessment & Plan:   Problem List Items Addressed This Visit       Respiratory   Mild intermittent asthma without complication    Stable. Following with pulmonology. Call with any concerns. Continue to monitor.        Relevant Medications   fluticasone-salmeterol (ADVAIR HFA) 115-21 MCG/ACT inhaler     Genitourinary   Vaginal dryness, menopausal    Will check on labs. Consider premarin.         Other   Hyperlipidemia    Rechecking labs today. Await results. Treat as needed.       Morbid obesity ([place])    Will check on coverage for medication. Will start if she decides today. Will work on diet and exercise with goal of losing 1-2lbs per week.       Other Visit Diagnoses     Routine general medical examination at a health care facility    -  Primary   Vaccines up to date. Screening labs checked today. Pap N/A. Mammogram and colonoscopy up to date. Continue diet and exercise. Call with any concerns.    Relevant Orders   CBC with Differential/Platelet (Completed)  Comprehensive metabolic panel (Completed)    Lipid Panel w/o Chol/HDL Ratio (Completed)   Urinalysis, Routine w reflex microscopic (Completed)   TSH (Completed)   LH (Completed)   FSH (Completed)   Estradiol (Completed)   Tinnitus of both ears       Will refer to audiology. Call with any concerns.    Relevant Orders   Ambulatory referral to Audiology   Hearing loss of right ear, unspecified hearing loss type       Testing normal in the office. Will refer to audiology. Call with any concerns.    Relevant Orders   Ambulatory referral to Audiology   Encounter for screening mammogram for malignant neoplasm of breast       Mammogram ordered today.   Relevant Orders   MM DIAG BREAST TOMO BILATERAL        Follow up plan: Return in about 6 months (around 06/22/2022).   LABORATORY TESTING:  - Pap smear: up to date  IMMUNIZATIONS:   - Tdap: Tetanus vaccination status reviewed: last tetanus booster within 10 years. - Influenza: Refused - Pneumovax: Refused - Prevnar: Not applicable - COVID: Up to date - HPV: Not applicable - Shingrix vaccine: will consider  SCREENING: -Mammogram: Ordered today  - Colonoscopy: cologuard ordered today   PATIENT COUNSELING:   Advised to take 1 mg of folate supplement per day if capable of pregnancy.   Sexuality: Discussed sexually transmitted diseases, partner selection, use of condoms, avoidance of unintended pregnancy  and contraceptive alternatives.   Advised to avoid cigarette smoking.  I discussed with the patient that most people either abstain from alcohol or drink within safe limits (<=14/week and <=4 drinks/occasion for males, <=7/weeks and <= 3 drinks/occasion for females) and that the risk for alcohol disorders and other health effects rises proportionally with the number of drinks per week and how often a drinker exceeds daily limits.  Discussed cessation/primary prevention of drug use and availability of treatment for abuse.   Diet: Encouraged to adjust caloric intake to  maintain  or achieve ideal body weight, to reduce intake of dietary saturated fat and total fat, to limit sodium intake by avoiding high sodium foods and not adding table salt, and to maintain adequate dietary potassium and calcium preferably from fresh fruits, vegetables, and low-fat dairy products.    stressed the importance of regular exercise  Injury prevention: Discussed safety belts, safety helmets, smoke detector, smoking near bedding or upholstery.   Dental health: Discussed importance of regular tooth brushing, flossing, and dental visits.    NEXT PREVENTATIVE PHYSICAL DUE IN 1 YEAR. Return in about 6 months (around 06/22/2022).

## 2021-12-21 LAB — TSH: TSH: 4.28 u[IU]/mL (ref 0.450–4.500)

## 2021-12-21 LAB — CBC WITH DIFFERENTIAL/PLATELET
Basophils Absolute: 0.1 10*3/uL (ref 0.0–0.2)
Basos: 1 %
EOS (ABSOLUTE): 0.3 10*3/uL (ref 0.0–0.4)
Eos: 3 %
Hematocrit: 41.2 % (ref 34.0–46.6)
Hemoglobin: 13.8 g/dL (ref 11.1–15.9)
Immature Grans (Abs): 0.1 10*3/uL (ref 0.0–0.1)
Immature Granulocytes: 1 %
Lymphocytes Absolute: 2.4 10*3/uL (ref 0.7–3.1)
Lymphs: 28 %
MCH: 30.8 pg (ref 26.6–33.0)
MCHC: 33.5 g/dL (ref 31.5–35.7)
MCV: 92 fL (ref 79–97)
Monocytes Absolute: 0.7 10*3/uL (ref 0.1–0.9)
Monocytes: 8 %
Neutrophils Absolute: 5 10*3/uL (ref 1.4–7.0)
Neutrophils: 59 %
Platelets: 283 10*3/uL (ref 150–450)
RBC: 4.48 x10E6/uL (ref 3.77–5.28)
RDW: 12.9 % (ref 11.7–15.4)
WBC: 8.6 10*3/uL (ref 3.4–10.8)

## 2021-12-21 LAB — COMPREHENSIVE METABOLIC PANEL
ALT: 42 IU/L — ABNORMAL HIGH (ref 0–32)
AST: 36 IU/L (ref 0–40)
Albumin/Globulin Ratio: 1.9 (ref 1.2–2.2)
Albumin: 4.3 g/dL (ref 3.8–4.9)
Alkaline Phosphatase: 42 IU/L — ABNORMAL LOW (ref 44–121)
BUN/Creatinine Ratio: 25 — ABNORMAL HIGH (ref 9–23)
BUN: 16 mg/dL (ref 6–24)
Bilirubin Total: <0.2 mg/dL (ref 0.0–1.2)
CO2: 25 mmol/L (ref 20–29)
Calcium: 9.2 mg/dL (ref 8.7–10.2)
Chloride: 101 mmol/L (ref 96–106)
Creatinine, Ser: 0.63 mg/dL (ref 0.57–1.00)
Globulin, Total: 2.3 g/dL (ref 1.5–4.5)
Glucose: 90 mg/dL (ref 70–99)
Potassium: 4 mmol/L (ref 3.5–5.2)
Sodium: 142 mmol/L (ref 134–144)
Total Protein: 6.6 g/dL (ref 6.0–8.5)
eGFR: 103 mL/min/{1.73_m2} (ref >59)

## 2021-12-21 LAB — LIPID PANEL W/O CHOL/HDL RATIO
Cholesterol, Total: 252 mg/dL — ABNORMAL HIGH (ref 100–199)
HDL: 55 mg/dL (ref >39)
LDL Chol Calc (NIH): 137 mg/dL — ABNORMAL HIGH (ref 0–99)
Triglycerides: 336 mg/dL — ABNORMAL HIGH (ref 0–149)
VLDL Cholesterol Cal: 60 mg/dL — ABNORMAL HIGH (ref 5–40)

## 2021-12-21 LAB — FOLLICLE STIMULATING HORMONE: FSH: 45.9 m[IU]/mL

## 2021-12-21 LAB — LUTEINIZING HORMONE: LH: 29.5 m[IU]/mL

## 2021-12-21 LAB — ESTRADIOL: Estradiol: <5 pg/mL

## 2021-12-21 NOTE — Assessment & Plan Note (Signed)
Will check on coverage for medication. Will start if she decides today. Will work on diet and exercise with goal of losing 1-2lbs per week.  ?

## 2021-12-21 NOTE — Assessment & Plan Note (Signed)
Rechecking labs today. Await results. Treat as needed.  °

## 2021-12-22 ENCOUNTER — Other Ambulatory Visit: Payer: Self-pay | Admitting: Family Medicine

## 2021-12-22 DIAGNOSIS — Z1231 Encounter for screening mammogram for malignant neoplasm of breast: Secondary | ICD-10-CM

## 2022-01-12 LAB — COLOGUARD: COLOGUARD: NEGATIVE

## 2022-02-14 ENCOUNTER — Ambulatory Visit
Admission: RE | Admit: 2022-02-14 | Discharge: 2022-02-14 | Disposition: A | Discharge status: Home / Self Care | Payer: BC Managed Care – PPO | Source: Ambulatory Visit | Attending: Family Medicine | Admitting: Family Medicine

## 2022-02-14 DIAGNOSIS — Z1231 Encounter for screening mammogram for malignant neoplasm of breast: Secondary | ICD-10-CM | POA: Diagnosis not present

## 2022-02-15 ENCOUNTER — Other Ambulatory Visit: Payer: Self-pay | Admitting: Family Medicine

## 2022-02-15 DIAGNOSIS — N63 Unspecified lump in unspecified breast: Secondary | ICD-10-CM

## 2022-02-15 DIAGNOSIS — R928 Other abnormal and inconclusive findings on diagnostic imaging of breast: Secondary | ICD-10-CM

## 2022-02-16 ENCOUNTER — Telehealth: Payer: Self-pay | Admitting: Family Medicine

## 2022-02-16 NOTE — Telephone Encounter (Signed)
They didn't get a complete mammogram, which is why they want further pictures. They should be contacting her shortly to get it scheduled. Nothing to worry about right now.  ?

## 2022-02-16 NOTE — Telephone Encounter (Signed)
Called and spoke with patient about her mammogram. Advised patient of Dr. Henriette Combs message.  ?

## 2022-02-16 NOTE — Telephone Encounter (Signed)
Patient was able to view her MM 3D SCREEN BREAST BILATERAL results through My Chart. Patient is concerned regarding the results and would like to speak with PCP as soon as possible. Patient is currently at work and states she will call back.  ? ?

## 2022-02-21 ENCOUNTER — Other Ambulatory Visit: Payer: Self-pay | Admitting: Family Medicine

## 2022-02-21 ENCOUNTER — Ambulatory Visit
Admission: RE | Admit: 2022-02-21 | Discharge: 2022-02-21 | Disposition: A | Discharge status: Home / Self Care | Payer: BC Managed Care – PPO | Source: Ambulatory Visit | Attending: Family Medicine | Admitting: Family Medicine

## 2022-02-21 DIAGNOSIS — N63 Unspecified lump in unspecified breast: Secondary | ICD-10-CM

## 2022-02-21 DIAGNOSIS — N6002 Solitary cyst of left breast: Secondary | ICD-10-CM

## 2022-02-21 DIAGNOSIS — R928 Other abnormal and inconclusive findings on diagnostic imaging of breast: Secondary | ICD-10-CM | POA: Insufficient documentation

## 2022-02-28 ENCOUNTER — Other Ambulatory Visit: Payer: BC Managed Care – PPO

## 2022-06-05 ENCOUNTER — Encounter: Payer: Self-pay | Admitting: Family Medicine

## 2022-06-29 ENCOUNTER — Ambulatory Visit: Payer: BC Managed Care – PPO | Admitting: Family Medicine

## 2022-07-02 IMAGING — MG MM DIGITAL DIAGNOSTIC UNILAT*L* W/ TOMO W/ CAD
8 series · 8 of 20 positions shown · non-contrast
Comparison: Previous exam(s).

CLINICAL DATA: Callback from screening mammogram for possible mass
left breast

EXAM:
DIGITAL DIAGNOSTIC UNILATERAL LEFT MAMMOGRAM WITH TOMOSYNTHESIS AND
CAD; ULTRASOUND LEFT BREAST LIMITED
TECHNIQUE: Left digital diagnostic mammography and breast tomosynthesis was
performed. The images were evaluated with computer-aided detection.;
Targeted ultrasound examination of the left breast was performed.

[L CC]
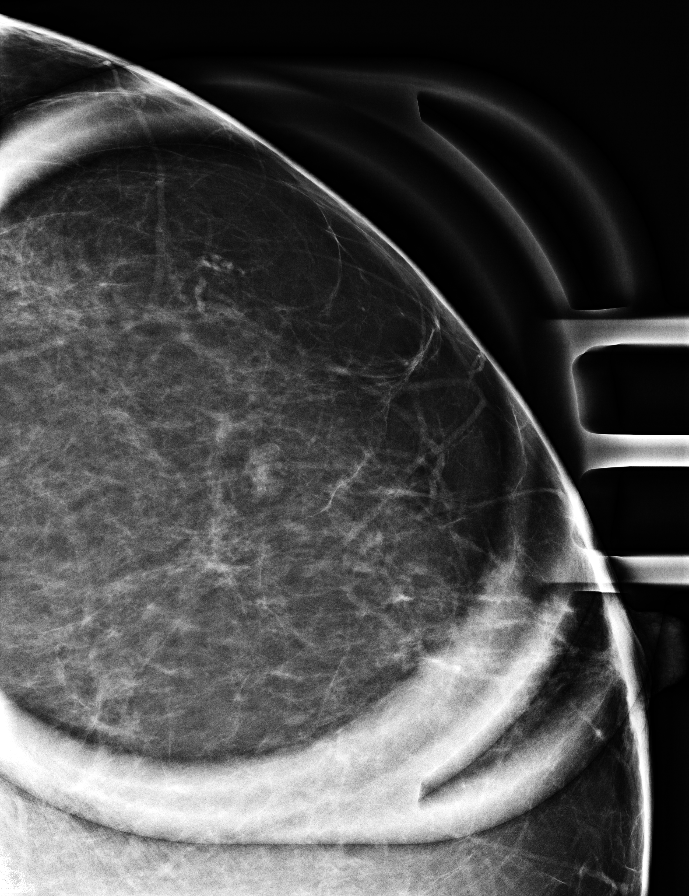

[L LM]
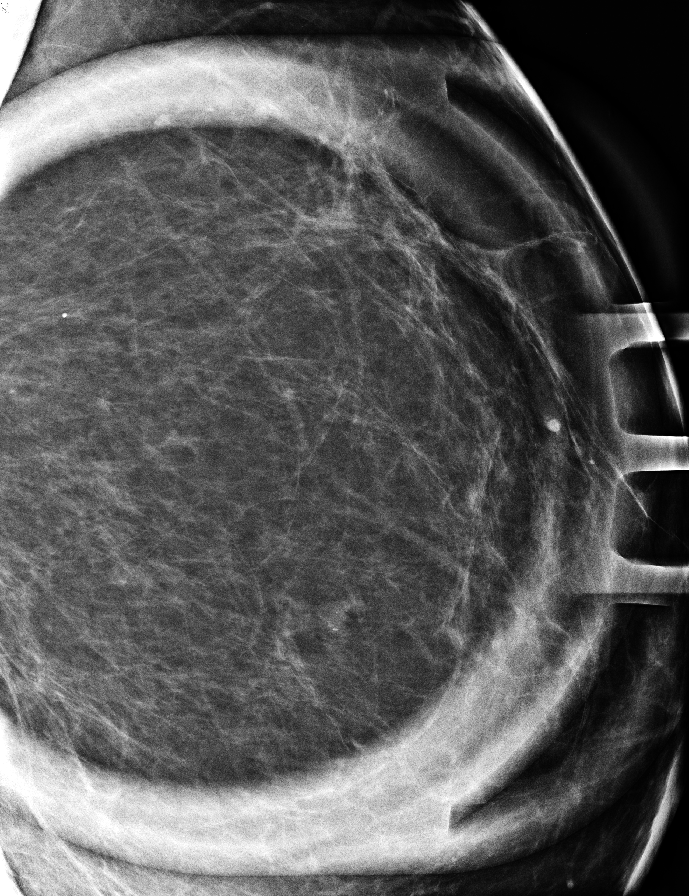

[L ML synth-2D]
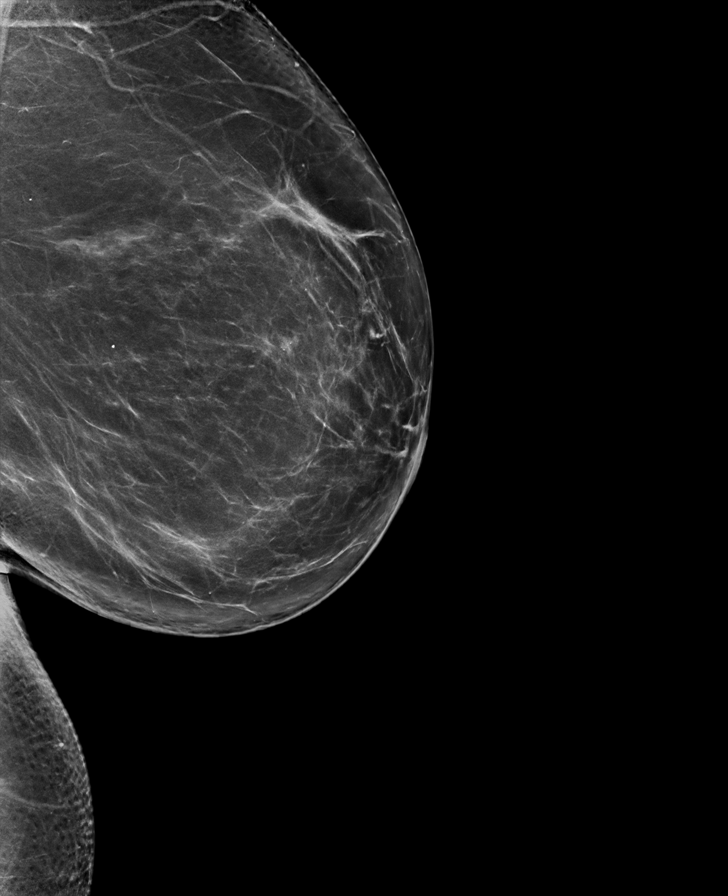

[L CC synth-2D]
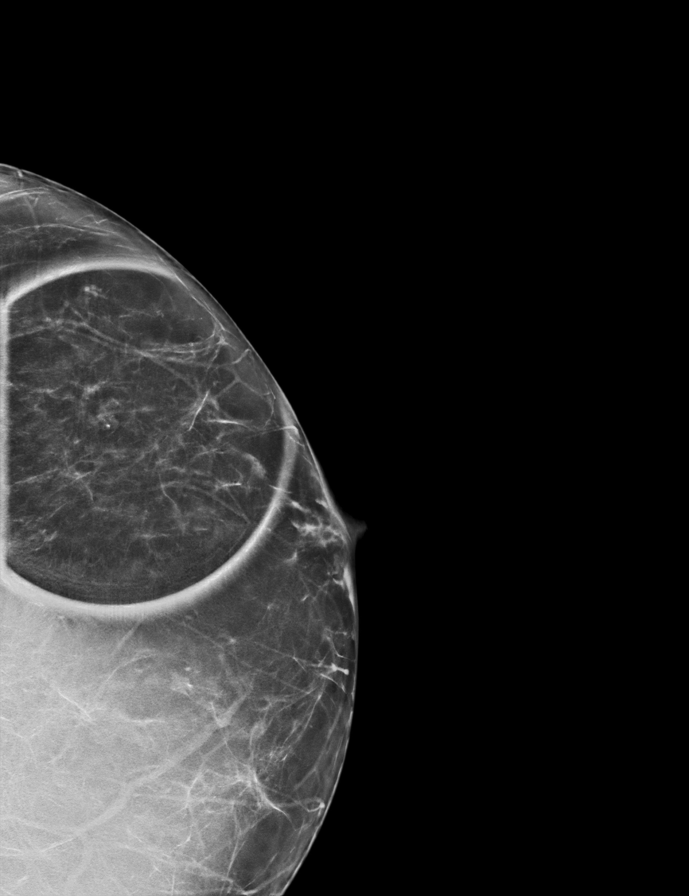

[L MLO synth-2D]
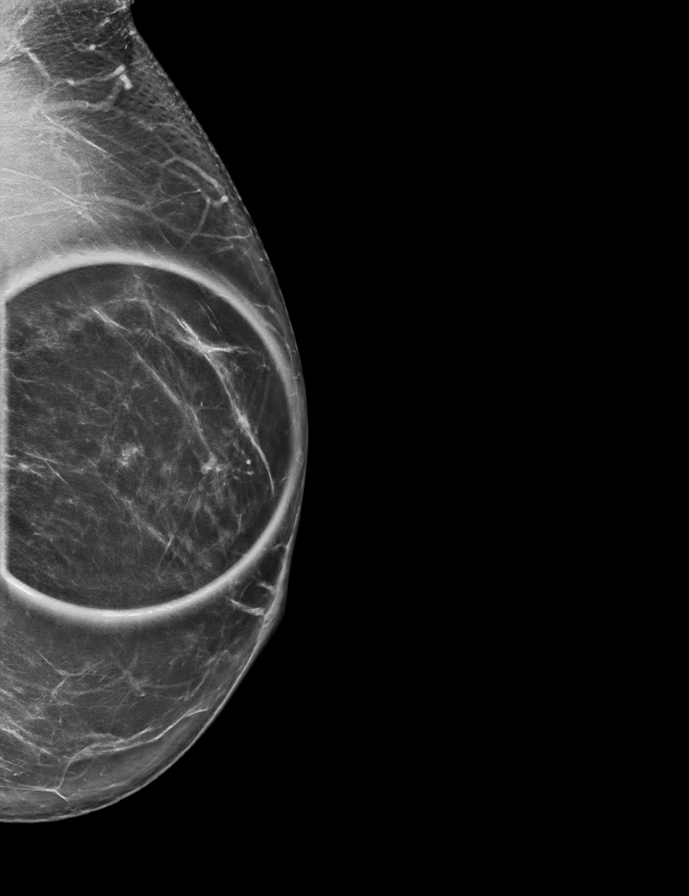

[L CC tomo · tomo slice 35/69.0]
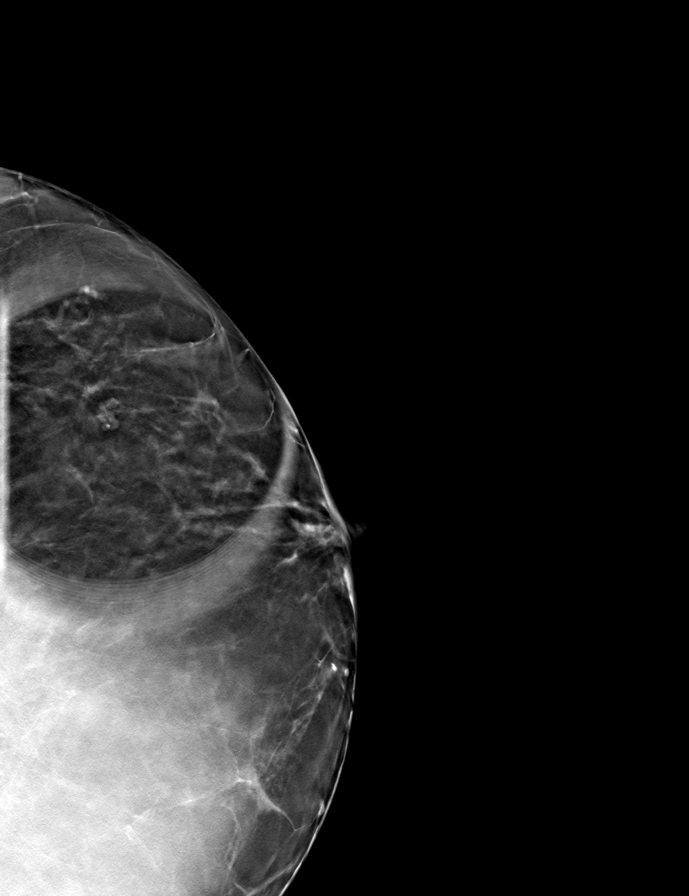

[L MLO tomo · tomo slice 45/88.0]
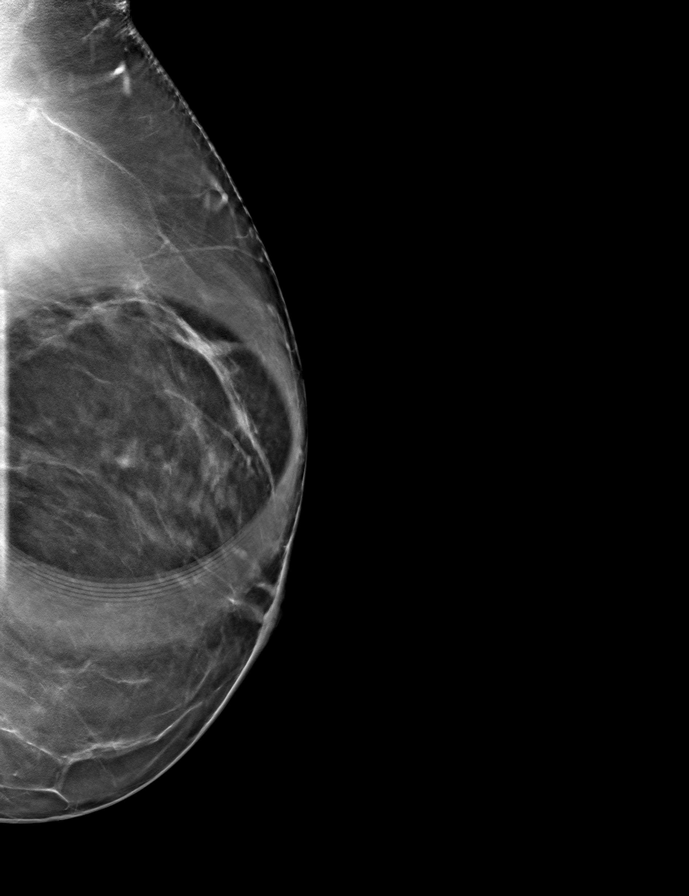

[L ML tomo · tomo slice 48/95.0]
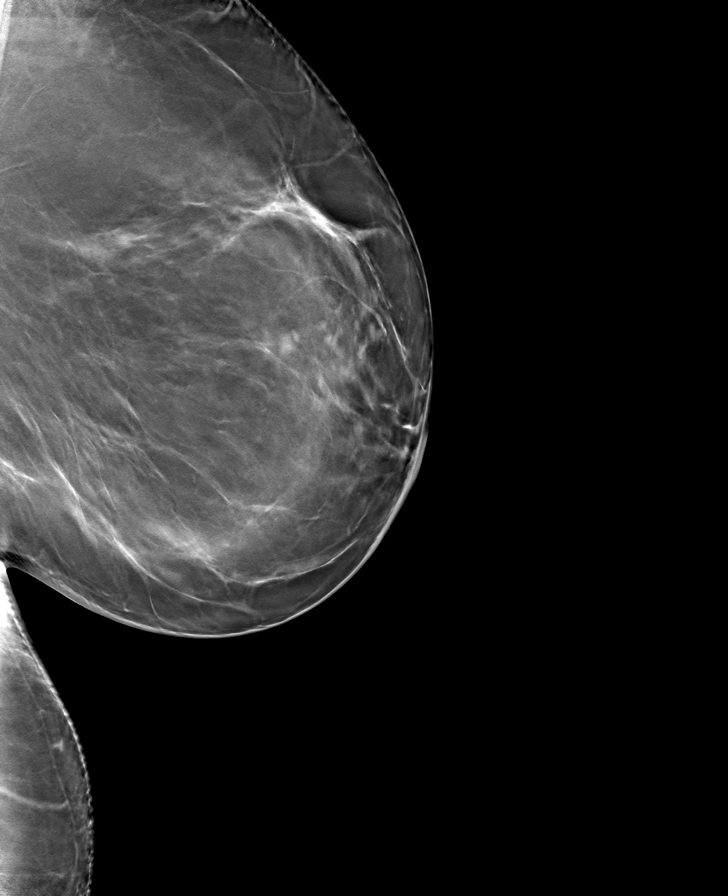

[8 of 20 positions shown; findings below may reference images not displayed]

ACR Breast Density Category b: There are scattered areas of
fibroglandular density.
FINDINGS: Spot compression cc and MLO views of the left breast, spot
magnification cc and lateral views the left, lateral view of left
are submitted. Previously noted mass in the upper-outer quadrant
left breast is persistent with associated layering calcifications
consistent with milk calcium.

Targeted ultrasound is performed, showing probable cluster cyst with
associated calcifications at the left breast do 2:30 o'clock 4 cm
from nipple measuring 9 x 4 x 6 mm correlating to the mammographic
mass.
IMPRESSION: Probable benign findings.

RECOMMENDATION:
Six-month follow-up mammogram and ultrasound of left breast.

I have discussed the findings and recommendations with the patient.
If applicable, a reminder letter will be sent to the patient
regarding the next appointment.

BI-RADS CATEGORY  3: Probably benign.

## 2022-07-02 IMAGING — US US BREAST*L* LIMITED INC AXILLA
2 series · 13 of 13 positions shown · non-contrast
Comparison: Previous exam(s).

CLINICAL DATA: Callback from screening mammogram for possible mass
left breast

EXAM:
DIGITAL DIAGNOSTIC UNILATERAL LEFT MAMMOGRAM WITH TOMOSYNTHESIS AND
CAD; ULTRASOUND LEFT BREAST LIMITED
TECHNIQUE: Left digital diagnostic mammography and breast tomosynthesis was
performed. The images were evaluated with computer-aided detection.;
Targeted ultrasound examination of the left breast was performed.

[Series 1: us breast*left* limited inc axilla · 0.09mm/px · 8 of 8 slices shown (1 of 2)]
[im 1/8]
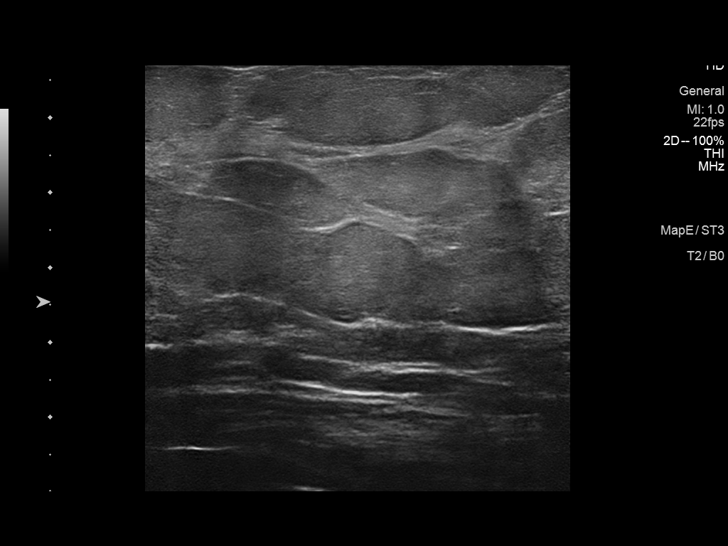
[im 2/8]
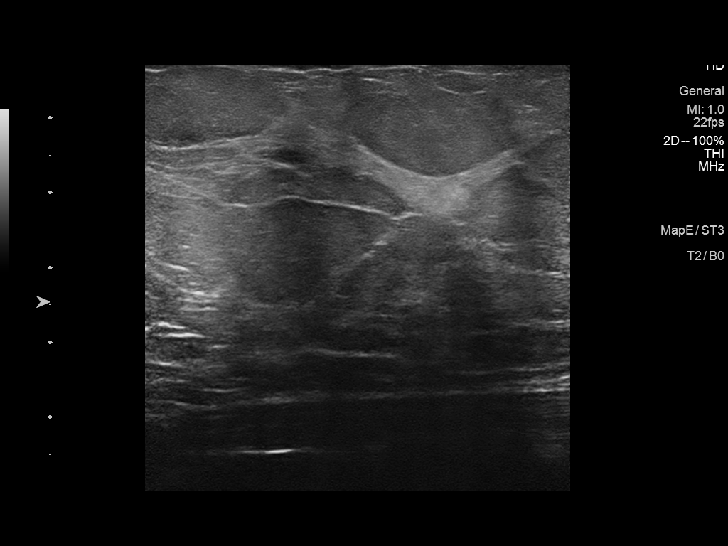
[im 3/8]
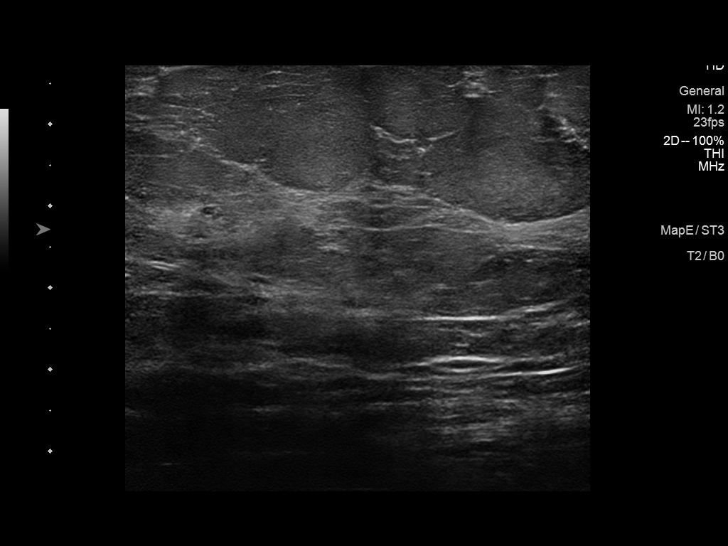
[im 4/8]
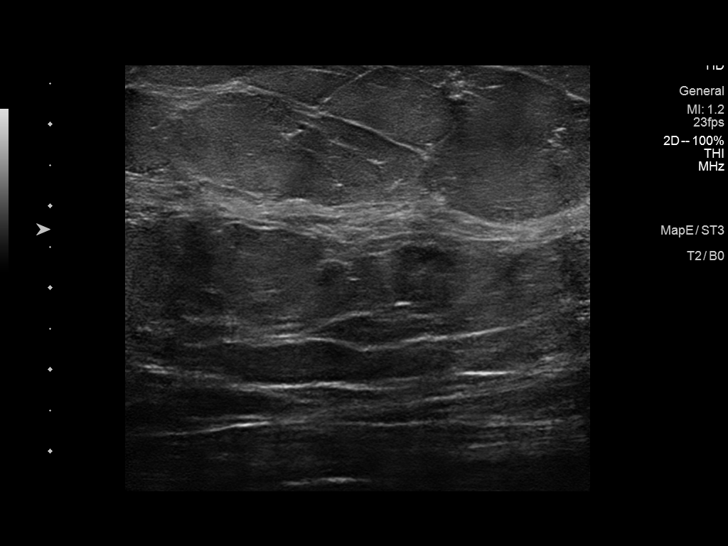
[im 5/8]
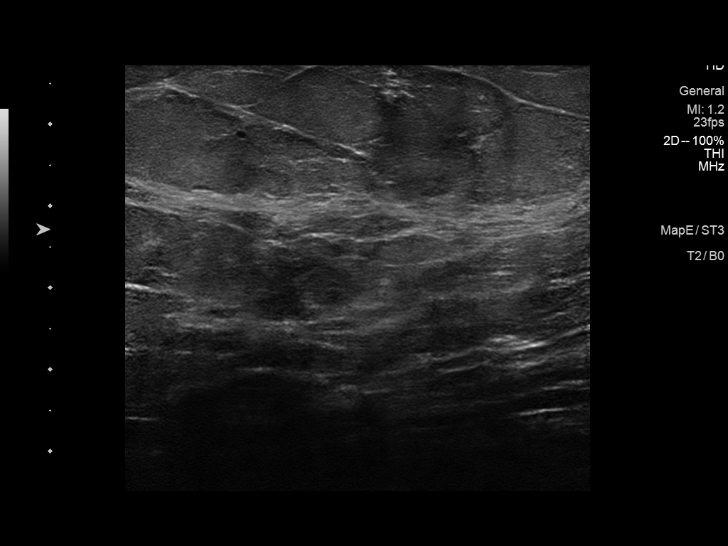
[im 6/8]
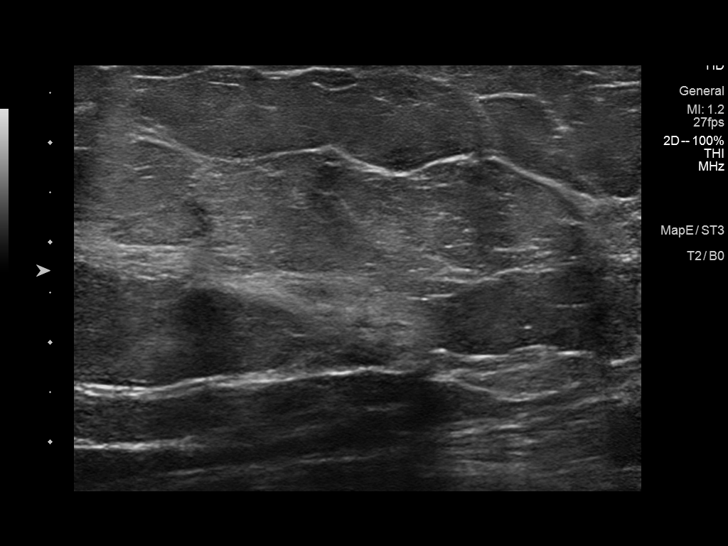
[im 7/8]
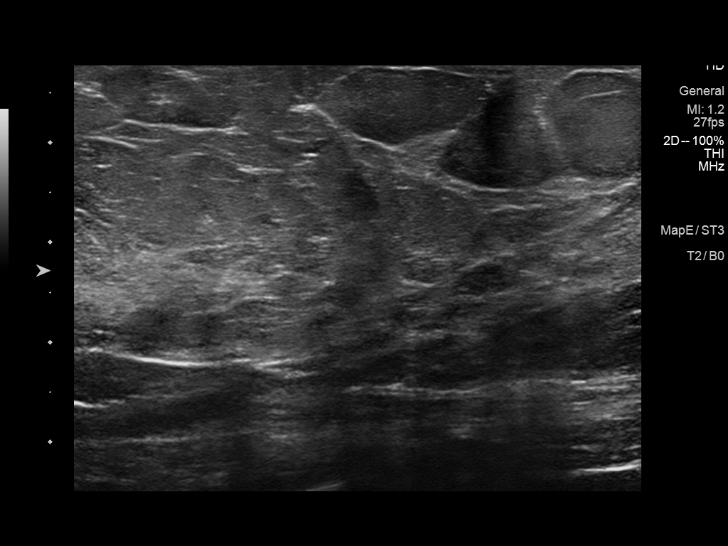
[im 8/8]
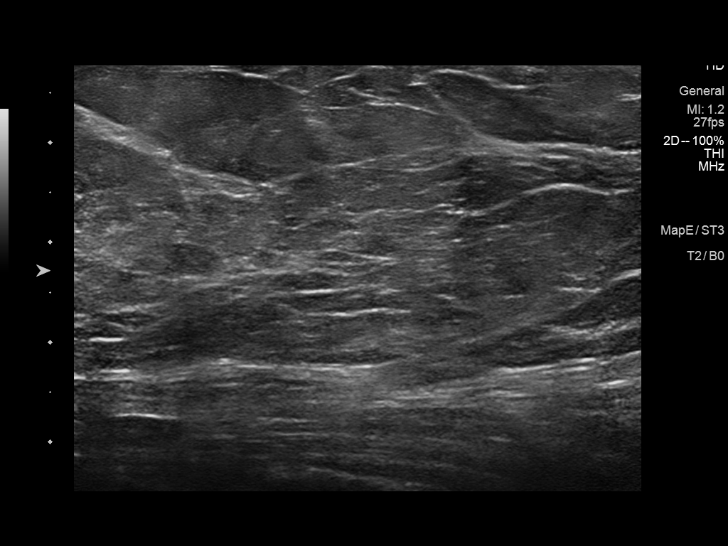

[Series 2: us breast*left* limited inc axilla · 0.08mm/px · 5 of 5 slices shown (2 of 2)]
[im 1/5]
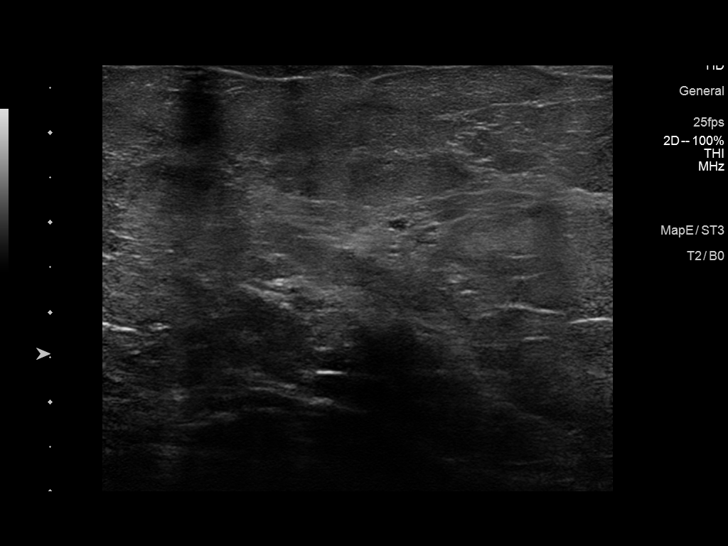
[im 2/5]
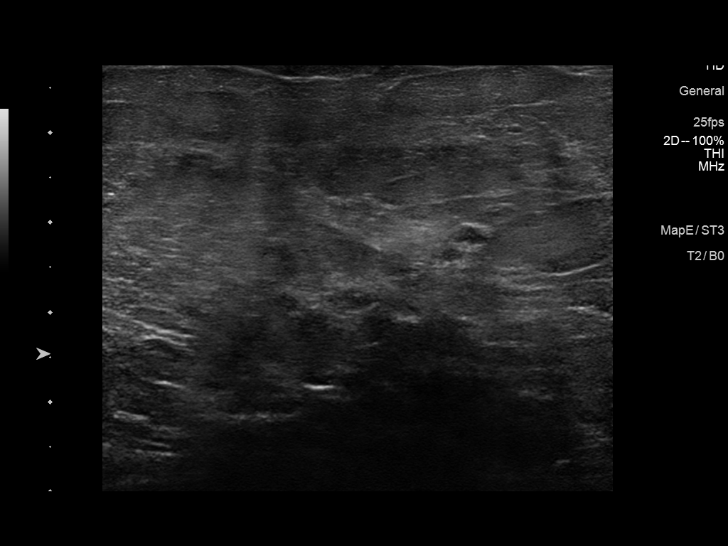
[im 3/5]
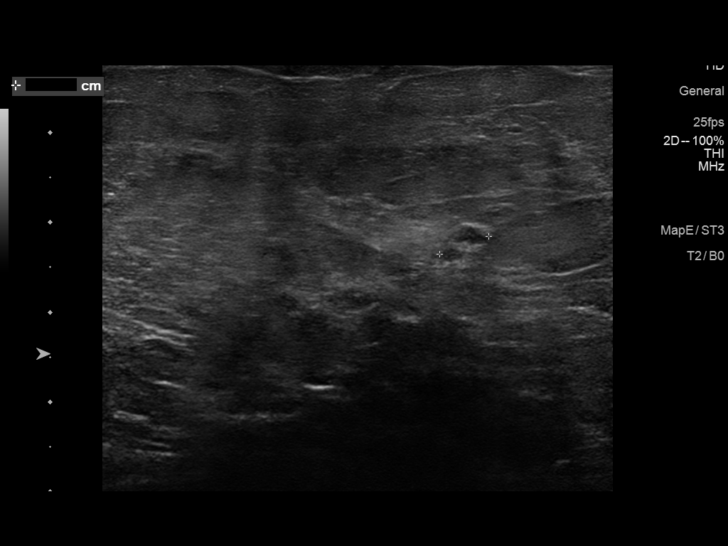
[im 4/5]
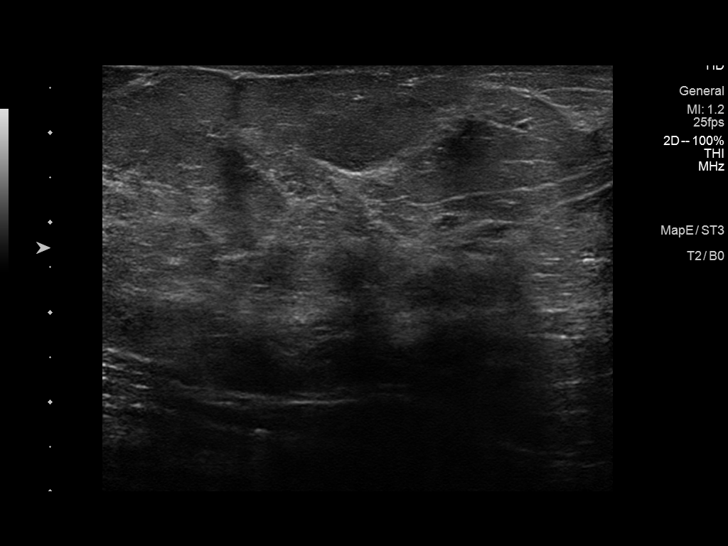
[im 5/5]
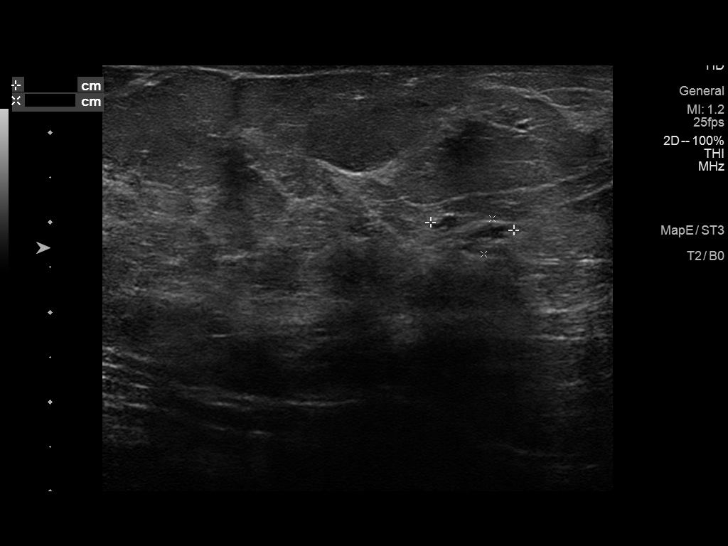

[13 of 13 positions shown; findings below may reference images not displayed]

ACR Breast Density Category b: There are scattered areas of
fibroglandular density.
FINDINGS: Spot compression cc and MLO views of the left breast, spot
magnification cc and lateral views the left, lateral view of left
are submitted. Previously noted mass in the upper-outer quadrant
left breast is persistent with associated layering calcifications
consistent with milk calcium.

Targeted ultrasound is performed, showing probable cluster cyst with
associated calcifications at the left breast do 2:30 o'clock 4 cm
from nipple measuring 9 x 4 x 6 mm correlating to the mammographic
mass.
IMPRESSION: Probable benign findings.

RECOMMENDATION:
Six-month follow-up mammogram and ultrasound of left breast.

I have discussed the findings and recommendations with the patient.
If applicable, a reminder letter will be sent to the patient
regarding the next appointment.

BI-RADS CATEGORY  3: Probably benign.

## 2022-07-06 ENCOUNTER — Ambulatory Visit (INDEPENDENT_AMBULATORY_CARE_PROVIDER_SITE_OTHER): Payer: BC Managed Care – PPO | Admitting: Family Medicine

## 2022-07-06 ENCOUNTER — Encounter: Payer: Self-pay | Admitting: Family Medicine

## 2022-07-06 VITALS — BP 117/68 | HR 76 | Temp 97.9°F | Wt 226.4 lb

## 2022-07-06 DIAGNOSIS — J4520 Mild intermittent asthma, uncomplicated: Secondary | ICD-10-CM

## 2022-07-06 DIAGNOSIS — E782 Mixed hyperlipidemia: Secondary | ICD-10-CM

## 2022-07-06 DIAGNOSIS — F338 Other recurrent depressive disorders: Secondary | ICD-10-CM | POA: Diagnosis not present

## 2022-07-06 MED ORDER — BUPROPION HCL ER (XL) 150 MG PO TB24: ORAL_TABLET | ORAL | 2 refills | Status: DC

## 2022-07-06 NOTE — Progress Notes (Signed)
BP 117/68   Pulse 76   Temp 97.9 F (36.6 C)   Wt 226 lb 6.4 oz (102.7 kg)   LMP  (LMP Unknown)   SpO2 98%   BMI 44.96 kg/m    Subjective:    Patient ID: Marissa Reynolds, female    DOB: [birth year], 56 y.o.   MRN: [id number]  HPI: Marissa Reynolds is a 56 y.o. female  Chief Complaint  Patient presents with   Hyperlipidemia   Asthma   HYPERLIPIDEMIA Hyperlipidemia status: excellent compliance Satisfied with current treatment?  no Side effects:  no Medication compliance: excellent compliance Past cholesterol meds: none Supplements: none Aspirin:  no The 10-year ASCVD risk score (Arnett DK, et al., 2019) is: 2.5%   Values used to calculate the score:     Age: 73 years     Sex: Female     Is Non-Hispanic African American: No     Diabetic: No     Tobacco smoker: No     Systolic Blood Pressure: 121 mmHg     Is BP treated: No     HDL Cholesterol: 55 mg/dL     Total Cholesterol: 252 mg/dL Chest pain:  no  ASTHMA Asthma status: controlled Satisfied with current treatment?: yes Albuterol/rescue inhaler frequency: occasionally Dyspnea frequency: rarely Wheezing frequency: rarely  Cough frequency: rarely Nocturnal symptom frequency: none Limitation of activity: no Current upper respiratory symptoms: no Triggers: cleaning, allergies Aerochamber/spacer use: no Visits to ER or Urgent Care in past year: no Pneumovax: Not up to Date Influenza: Not up to Date  DEPRESSION Mood status: worse Satisfied with current treatment?: no Symptom severity: moderate  Duration of current treatment :  not on anything Psychotherapy/counseling: no  Previous psychiatric medications: none Depressed mood: yes Anxious mood: yes Anhedonia: no Significant weight loss or gain: no Insomnia: no  Fatigue: yes Feelings of worthlessness or guilt: yes Impaired concentration/indecisiveness: yes Suicidal ideations: no Hopelessness: yes Crying spells: no    07/06/2022    2:10 PM 12/20/2021     1:32 PM 05/26/2021    1:30 PM 02/10/2020   11:52 AM 12/17/2018    2:19 PM  Depression screen PHQ 2/9  Decreased Interest 1 0 1 0 0  Down, Depressed, Hopeless 1 0 1 0 0  PHQ - 2 Score 2 0 2 0 0  Altered sleeping _0 Tired, decreased energy _1 Change in appetite 3 1 0 3 2  Feeling bad or failure about yourself  0 0 0 0 0  Trouble concentrating 1 0 0 0 0  Moving slowly or fidgety/restless 0 0 0 0 0  Suicidal thoughts 0 0 0 0 0  PHQ-9 Score _2 Difficult doing work/chores Somewhat difficult  Not difficult at all Not difficult at all Not difficult at all    Relevant past medical, surgical, family and social history reviewed and updated as indicated. Interim medical history since our last visit reviewed. Allergies and medications reviewed and updated.  Review of Systems  Constitutional: Negative.   Respiratory: Negative.    Cardiovascular: Negative.   Gastrointestinal: Negative.   Musculoskeletal: Negative.   Psychiatric/Behavioral:  Positive for dysphoric mood. Negative for agitation, behavioral problems, confusion, decreased concentration, hallucinations, self-injury, sleep disturbance and suicidal ideas. The patient is not nervous/anxious and is not hyperactive.     Per HPI unless specifically indicated above     Objective:    BP 117/68   Pulse  76   Temp 97.9 F (36.6 C)   Wt 226 lb 6.4 oz (102.7 kg)   LMP  (LMP Unknown)   SpO2 98%   BMI 44.96 kg/m   Wt Readings from Last 3 Encounters:  07/06/22 226 lb 6.4 oz (102.7 kg)  12/20/21 217 lb 3.2 oz (98.5 kg)  05/26/21 215 lb 12.8 oz (97.9 kg)    Physical Exam Vitals and nursing note reviewed.  Constitutional:      General: She is not in acute distress.    Appearance: Normal appearance. She is obese. She is not ill-appearing, toxic-appearing or diaphoretic.  HENT:     Head: Normocephalic and atraumatic.     Right Ear: External ear normal.     Left Ear: External ear normal.     Nose: Nose normal.      Mouth/Throat:     Mouth: Mucous membranes are moist.     Pharynx: Oropharynx is clear.  Eyes:     General: No scleral icterus.       Right eye: No discharge.        Left eye: No discharge.     Extraocular Movements: Extraocular movements intact.     Conjunctiva/sclera: Conjunctivae normal.     Pupils: Pupils are equal, round, and reactive to light.  Cardiovascular:     Rate and Rhythm: Normal rate and regular rhythm.     Pulses: Normal pulses.     Heart sounds: Normal heart sounds. No murmur heard.    No friction rub. No gallop.  Pulmonary:     Effort: Pulmonary effort is normal. No respiratory distress.     Breath sounds: Normal breath sounds. No stridor. No wheezing, rhonchi or rales.  Chest:     Chest wall: No tenderness.  Musculoskeletal:        General: Normal range of motion.     Cervical back: Normal range of motion and neck supple.  Skin:    General: Skin is warm and dry.     Capillary Refill: Capillary refill takes less than 2 seconds.     Coloration: Skin is not jaundiced or pale.     Findings: No bruising, erythema, lesion or rash.  Neurological:     General: No focal deficit present.     Mental Status: She is alert and oriented to person, place, and time. Mental status is at baseline.  Psychiatric:        Mood and Affect: Mood normal.        Behavior: Behavior normal.        Thought Content: Thought content normal.        Judgment: Judgment normal.     Results for orders placed or performed in visit on 12/20/21  CBC with Differential/Platelet  Result Value Ref Range   WBC 8.6 3.4 - 10.8 x10E3/uL   RBC 4.48 3.77 - 5.28 x10E6/uL   Hemoglobin 13.8 11.1 - 15.9 g/dL   Hematocrit 41.2 34.0 - 46.6 %   MCV 92 79 - 97 fL   MCH 30.8 26.6 - 33.0 pg   MCHC 33.5 31.5 - 35.7 g/dL   RDW 12.9 11.7 - 15.4 %   Platelets 283 150 - 450 x10E3/uL   Neutrophils 59 Not Estab. %   Lymphs 28 Not Estab. %   Monocytes 8 Not Estab. %   Eos 3 Not Estab. %   Basos 1 Not Estab. %    Neutrophils Absolute 5.0 1.4 - 7.0 x10E3/uL   Lymphocytes Absolute 2.4 0.7 -  3.1 x10E3/uL   Monocytes Absolute 0.7 0.1 - 0.9 x10E3/uL   EOS (ABSOLUTE) 0.3 0.0 - 0.4 x10E3/uL   Basophils Absolute 0.1 0.0 - 0.2 x10E3/uL   Immature Granulocytes 1 Not Estab. %   Immature Grans (Abs) 0.1 0.0 - 0.1 x10E3/uL  Comprehensive metabolic panel  Result Value Ref Range   Glucose 90 70 - 99 mg/dL   BUN 16 6 - 24 mg/dL   Creatinine, Ser 0.63 0.57 - 1.00 mg/dL   eGFR 103 >59 mL/min/1.73   BUN/Creatinine Ratio 25 (H) 9 - 23   Sodium 142 134 - 144 mmol/L   Potassium 4.0 3.5 - 5.2 mmol/L   Chloride 101 96 - 106 mmol/L   CO2 25 20 - 29 mmol/L   Calcium 9.2 8.7 - 10.2 mg/dL   Total Protein 6.6 6.0 - 8.5 g/dL   Albumin 4.3 3.8 - 4.9 g/dL   Globulin, Total 2.3 1.5 - 4.5 g/dL   Albumin/Globulin Ratio 1.9 1.2 - 2.2   Bilirubin Total <0.2 0.0 - 1.2 mg/dL   Alkaline Phosphatase 42 (L) 44 - 121 IU/L   AST 36 0 - 40 IU/L   ALT 42 (H) 0 - 32 IU/L  Lipid Panel w/o Chol/HDL Ratio  Result Value Ref Range   Cholesterol, Total 252 (H) 100 - 199 mg/dL   Triglycerides 336 (H) 0 - 149 mg/dL   HDL 55 >39 mg/dL   VLDL Cholesterol Cal 60 (H) 5 - 40 mg/dL   LDL Chol Calc (NIH) 137 (H) 0 - 99 mg/dL  Urinalysis, Routine w reflex microscopic  Result Value Ref Range   Specific Gravity, UA 1.015 1.005 - 1.030   pH, UA 8.0 (H) 5.0 - 7.5   Color, UA Yellow Yellow   Appearance Ur Clear Clear   Leukocytes,UA Negative Negative   Protein,UA Negative Negative/Trace   Glucose, UA Negative Negative   Ketones, UA Negative Negative   RBC, UA Negative Negative   Bilirubin, UA Negative Negative   Urobilinogen, Ur 0.2 0.2 - 1.0 mg/dL   Nitrite, UA Negative Negative  TSH  Result Value Ref Range   TSH 4.280 0.450 - 4.500 uIU/mL  LH  Result Value Ref Range   LH 29.5 mIU/mL  FSH  Result Value Ref Range   FSH 45.9 mIU/mL  Estradiol  Result Value Ref Range   Estradiol <5.0 pg/mL      Assessment & Plan:   Problem List  Items Addressed This Visit       Respiratory   Mild intermittent asthma without complication    Stable. Continue to follow with pulmonology. Call with any concerns. Continue to monitor.       Relevant Orders   CBC with Differential/Platelet     Other   Hyperlipidemia - Primary    Rechecking labs today. Await results. Treat as needed.       Relevant Orders   Comprehensive metabolic panel   Lipid Panel w/o Chol/HDL Ratio   Morbid obesity ([place])    Encouraged diet and exercise with goal of losing 1-2lbs per week. Will start wellbutrin. Call with any concerns.       Seasonal affective disorder ([place])    Not doing well. Will start wellbutrin and check in in about a month. Call with any concerns.      Relevant Medications   buPROPion (WELLBUTRIN XL) 150 MG 24 hr tablet   Other Relevant Orders   TSH     Follow up plan: Return in about 4 weeks (  around 08/03/2022) for virtual OK.

## 2022-07-06 NOTE — Assessment & Plan Note (Signed)
Stable. Continue to follow with pulmonology. Call with any concerns. Continue to monitor.  

## 2022-07-06 NOTE — Assessment & Plan Note (Signed)
Not doing well. Will start wellbutrin and check in in about a month. Call with any concerns.

## 2022-07-06 NOTE — Assessment & Plan Note (Signed)
Encouraged diet and exercise with goal of losing 1-2lbs per week. Will start wellbutrin. Call with any concerns.

## 2022-07-06 NOTE — Assessment & Plan Note (Signed)
Rechecking labs today. Await results. Treat as needed.  °

## 2022-07-07 LAB — LIPID PANEL W/O CHOL/HDL RATIO
Cholesterol, Total: 170 mg/dL (ref 100–199)
HDL: 39 mg/dL — ABNORMAL LOW (ref >39)
LDL Chol Calc (NIH): 94 mg/dL (ref 0–99)
Triglycerides: 215 mg/dL — ABNORMAL HIGH (ref 0–149)
VLDL Cholesterol Cal: 37 mg/dL (ref 5–40)

## 2022-07-07 LAB — COMPREHENSIVE METABOLIC PANEL
ALT: 37 IU/L — ABNORMAL HIGH (ref 0–32)
AST: 32 IU/L (ref 0–40)
Albumin/Globulin Ratio: 1.9 (ref 1.2–2.2)
Albumin: 4 g/dL (ref 3.8–4.9)
Alkaline Phosphatase: 37 IU/L — ABNORMAL LOW (ref 44–121)
BUN/Creatinine Ratio: 20 (ref 9–23)
BUN: 16 mg/dL (ref 6–24)
Bilirubin Total: 0.4 mg/dL (ref 0.0–1.2)
CO2: 24 mmol/L (ref 20–29)
Calcium: 8.8 mg/dL (ref 8.7–10.2)
Chloride: 103 mmol/L (ref 96–106)
Creatinine, Ser: 0.82 mg/dL (ref 0.57–1.00)
Globulin, Total: 2.1 g/dL (ref 1.5–4.5)
Glucose: 134 mg/dL — ABNORMAL HIGH (ref 70–99)
Potassium: 4.2 mmol/L (ref 3.5–5.2)
Sodium: 143 mmol/L (ref 134–144)
Total Protein: 6.1 g/dL (ref 6.0–8.5)
eGFR: 83 mL/min/{1.73_m2} (ref >59)

## 2022-07-07 LAB — CBC WITH DIFFERENTIAL/PLATELET
Basophils Absolute: 0.1 10*3/uL (ref 0.0–0.2)
Basos: 1 %
EOS (ABSOLUTE): 0.2 10*3/uL (ref 0.0–0.4)
Eos: 3 %
Hematocrit: 40.7 % (ref 34.0–46.6)
Hemoglobin: 13.9 g/dL (ref 11.1–15.9)
Immature Grans (Abs): 0 10*3/uL (ref 0.0–0.1)
Immature Granulocytes: 0 %
Lymphocytes Absolute: 2.8 10*3/uL (ref 0.7–3.1)
Lymphs: 34 %
MCH: 32 pg (ref 26.6–33.0)
MCHC: 34.2 g/dL (ref 31.5–35.7)
MCV: 94 fL (ref 79–97)
Monocytes Absolute: 0.5 10*3/uL (ref 0.1–0.9)
Monocytes: 6 %
Neutrophils Absolute: 4.5 10*3/uL (ref 1.4–7.0)
Neutrophils: 56 %
Platelets: 217 10*3/uL (ref 150–450)
RBC: 4.34 x10E6/uL (ref 3.77–5.28)
RDW: 12.4 % (ref 11.7–15.4)
WBC: 8.1 10*3/uL (ref 3.4–10.8)

## 2022-07-07 LAB — TSH: TSH: 2.63 u[IU]/mL (ref 0.450–4.500)

## 2022-08-03 ENCOUNTER — Encounter: Payer: Self-pay | Admitting: Family Medicine

## 2022-08-03 ENCOUNTER — Telehealth (INDEPENDENT_AMBULATORY_CARE_PROVIDER_SITE_OTHER): Payer: BC Managed Care – PPO | Admitting: Family Medicine

## 2022-08-03 DIAGNOSIS — F338 Other recurrent depressive disorders: Secondary | ICD-10-CM

## 2022-08-03 MED ORDER — BUPROPION HCL ER (XL) 300 MG PO TB24: 300.0000 mg | ORAL_TABLET | Freq: Every day | ORAL | 1 refills | Status: DC

## 2022-08-03 NOTE — Progress Notes (Signed)
LMP  (LMP Unknown)    Subjective:    Patient ID: Marissa Reynolds, female    DOB: [birth year], 56 y.o.   MRN: [id number]  HPI: Marissa Reynolds is a 56 y.o. female  Chief Complaint  Patient presents with   Seasonal affective disorder     Patient was started on Wellbutrin last month, patient states medication has been working well for me.    DEPRESSION Mood status: better Satisfied with current treatment?: yes Symptom severity: mild  Duration of current treatment : chronic Side effects: no Medication compliance: excellent compliance Psychotherapy/counseling: no  Previous psychiatric medications: wellbutrin Depressed mood: no Anxious mood: no Anhedonia: no Significant weight loss or gain: no Insomnia: no  Fatigue: no Feelings of worthlessness or guilt: no Impaired concentration/indecisiveness: no Suicidal ideations: no Hopelessness: no Crying spells: no    08/03/2022   11:18 AM 07/06/2022    2:10 PM 12/20/2021    1:32 PM 05/26/2021    1:30 PM 02/10/2020   11:52 AM  Depression screen PHQ 2/9  Decreased Interest 0 1 0 1 0  Down, Depressed, Hopeless 0 1 0 1 0  PHQ - 2 Score 0 2 0 2 0  Altered sleeping 0 1 1 3 2   Tired, decreased energy 0 2 1 1 1   Change in appetite 0 3 1 0 3  Feeling bad or failure about yourself  0 0 0 0 0  Trouble concentrating 0 1 0 0 0  Moving slowly or fidgety/restless 0 0 0 0 0  Suicidal thoughts 0 0 0 0 0  PHQ-9 Score 0 9 3 6 6   Difficult doing work/chores  Somewhat difficult  Not difficult at all Not difficult at all    Relevant past medical, surgical, family and social history reviewed and updated as indicated. Interim medical history since our last visit reviewed. Allergies and medications reviewed and updated.  Review of Systems  Constitutional: Negative.   Respiratory: Negative.    Cardiovascular: Negative.   Gastrointestinal: Negative.   Musculoskeletal: Negative.   Neurological: Negative.   Psychiatric/Behavioral: Negative.      Per  HPI unless specifically indicated above     Objective:    LMP  (LMP Unknown)   Wt Readings from Last 3 Encounters:  07/06/22 226 lb 6.4 oz (102.7 kg)  12/20/21 217 lb 3.2 oz (98.5 kg)  05/26/21 215 lb 12.8 oz (97.9 kg)    Physical Exam Vitals and nursing note reviewed.  Constitutional:      General: She is not in acute distress.    Appearance: Normal appearance. She is obese. She is not ill-appearing, toxic-appearing or diaphoretic.  HENT:     Head: Normocephalic and atraumatic.     Right Ear: External ear normal.     Left Ear: External ear normal.     Nose: Nose normal.     Mouth/Throat:     Mouth: Mucous membranes are moist.     Pharynx: Oropharynx is clear.  Eyes:     General: No scleral icterus.       Right eye: No discharge.        Left eye: No discharge.     Conjunctiva/sclera: Conjunctivae normal.     Pupils: Pupils are equal, round, and reactive to light.  Pulmonary:     Effort: Pulmonary effort is normal. No respiratory distress.     Comments: Speaking in full sentences Musculoskeletal:        General: Normal range of motion.     Cervical  back: Normal range of motion.  Skin:    Coloration: Skin is not jaundiced or pale.     Findings: No bruising, erythema, lesion or rash.  Neurological:     Mental Status: She is alert and oriented to person, place, and time. Mental status is at baseline.  Psychiatric:        Mood and Affect: Mood normal.        Behavior: Behavior normal.        Thought Content: Thought content normal.        Judgment: Judgment normal.     Results for orders placed or performed in visit on 07/06/22  CBC with Differential/Platelet  Result Value Ref Range   WBC 8.1 3.4 - 10.8 x10E3/uL   RBC 4.34 3.77 - 5.28 x10E6/uL   Hemoglobin 13.9 11.1 - 15.9 g/dL   Hematocrit 40.7 34.0 - 46.6 %   MCV 94 79 - 97 fL   MCH 32.0 26.6 - 33.0 pg   MCHC 34.2 31.5 - 35.7 g/dL   RDW 12.4 11.7 - 15.4 %   Platelets 217 150 - 450 x10E3/uL   Neutrophils 56  Not Estab. %   Lymphs 34 Not Estab. %   Monocytes 6 Not Estab. %   Eos 3 Not Estab. %   Basos 1 Not Estab. %   Neutrophils Absolute 4.5 1.4 - 7.0 x10E3/uL   Lymphocytes Absolute 2.8 0.7 - 3.1 x10E3/uL   Monocytes Absolute 0.5 0.1 - 0.9 x10E3/uL   EOS (ABSOLUTE) 0.2 0.0 - 0.4 x10E3/uL   Basophils Absolute 0.1 0.0 - 0.2 x10E3/uL   Immature Granulocytes 0 Not Estab. %   Immature Grans (Abs) 0.0 0.0 - 0.1 x10E3/uL  Comprehensive metabolic panel  Result Value Ref Range   Glucose 134 (H) 70 - 99 mg/dL   BUN 16 6 - 24 mg/dL   Creatinine, Ser 0.82 0.57 - 1.00 mg/dL   eGFR 83 >59 mL/min/1.73   BUN/Creatinine Ratio 20 9 - 23   Sodium 143 134 - 144 mmol/L   Potassium 4.2 3.5 - 5.2 mmol/L   Chloride 103 96 - 106 mmol/L   CO2 24 20 - 29 mmol/L   Calcium 8.8 8.7 - 10.2 mg/dL   Total Protein 6.1 6.0 - 8.5 g/dL   Albumin 4.0 3.8 - 4.9 g/dL   Globulin, Total 2.1 1.5 - 4.5 g/dL   Albumin/Globulin Ratio 1.9 1.2 - 2.2   Bilirubin Total 0.4 0.0 - 1.2 mg/dL   Alkaline Phosphatase 37 (L) 44 - 121 IU/L   AST 32 0 - 40 IU/L   ALT 37 (H) 0 - 32 IU/L  Lipid Panel w/o Chol/HDL Ratio  Result Value Ref Range   Cholesterol, Total 170 100 - 199 mg/dL   Triglycerides 215 (H) 0 - 149 mg/dL   HDL 39 (L) >39 mg/dL   VLDL Cholesterol Cal 37 5 - 40 mg/dL   LDL Chol Calc (NIH) 94 0 - 99 mg/dL  TSH  Result Value Ref Range   TSH 2.630 0.450 - 4.500 uIU/mL      Assessment & Plan:   Problem List Items Addressed This Visit       Other   Seasonal affective disorder (HCC)    Under good control on current regimen. Continue current regimen. Continue to monitor. Call with any concerns. Refills given. Continue current regimen for about the next 6 months then we will talk about coming off.        Relevant Medications   buPROPion (  WELLBUTRIN XL) 300 MG 24 hr tablet     Follow up plan: Return after 12/21/22 for physical.    This visit was completed via video visit through MyChart due to the restrictions of  the COVID-19 pandemic. All issues as above were discussed and addressed. Physical exam was done as above through visual confirmation on video through MyChart. If it was felt that the patient should be evaluated in the office, they were directed there. The patient verbally consented to this visit. Location of the patient: parking lot Location of the provider: work Those involved with this call:  Provider: Park Liter, DO CMA: Louanna Raw, [place] Desk/Registration: [employer]  Time spent on call:  15 minutes with patient face to face via video conference. More than 50% of this time was spent in counseling and coordination of care. 23 minutes total spent in review of patient's record and preparation of their chart.

## 2022-08-03 NOTE — Progress Notes (Signed)
LVM asking patient to call back to schedule her follow up appointment 

## 2022-08-03 NOTE — Assessment & Plan Note (Signed)
Under good control on current regimen. Continue current regimen. Continue to monitor. Call with any concerns. Refills given. Continue current regimen for about the next 6 months then we will talk about coming off.

## 2022-08-24 ENCOUNTER — Ambulatory Visit
Admission: RE | Admit: 2022-08-24 | Discharge: 2022-08-24 | Disposition: A | Discharge status: Home / Self Care | Payer: BC Managed Care – PPO | Source: Ambulatory Visit | Attending: Family Medicine | Admitting: Family Medicine

## 2022-08-24 DIAGNOSIS — N6002 Solitary cyst of left breast: Secondary | ICD-10-CM | POA: Diagnosis present

## 2022-08-31 ENCOUNTER — Other Ambulatory Visit: Payer: BC Managed Care – PPO

## 2022-10-06 ENCOUNTER — Other Ambulatory Visit: Payer: Self-pay | Admitting: Family Medicine

## 2022-10-06 NOTE — Telephone Encounter (Signed)
Unable to refill per protocol,request is too soon. Last refill 08/04/22 for 90 and 1 refill. Will refuse duplicate request.  Requested Prescriptions  Pending Prescriptions Disp Refills   buPROPion (WELLBUTRIN XL) 150 MG 24 hr tablet [Pharmacy Med Name: buPROPion HCl ER (XL) 150 MG Oral Tablet Extended Release 24 Hour] 60 tablet 0    Sig: TAKE 1 TABLET BY MOUTH IN THE MORNING FOR 1 WEEK, THEN TAKE 1 TABLET TWICE DAILY.     Psychiatry: Antidepressants - bupropion Failed - 10/06/2022  9:23 AM      Failed - ALT in normal range and within 360 days    ALT  Date Value Ref Range Status  07/06/2022 37 (H) 0 - 32 IU/L Final         Passed - Cr in normal range and within 360 days    Creatinine, Ser  Date Value Ref Range Status  07/06/2022 0.82 0.57 - 1.00 mg/dL Final         Passed - AST in normal range and within 360 days    AST  Date Value Ref Range Status  07/06/2022 32 0 - 40 IU/L Final         Passed - Completed PHQ-2 or PHQ-9 in the last 360 days      Passed - Last BP in normal range    BP Readings from Last 1 Encounters:  07/06/22 117/68         Passed - Valid encounter within last 6 months    Recent Outpatient Visits           2 months ago Seasonal affective disorder (HCC)   [facility] [address] [place], Megan P, DO   3 months ago Mixed hyperlipidemia   [facility], Megan P, DO   9 months ago Routine general medical examination at a health care facility   [facility] [place] P, DO   1 year ago Acute non-recurrent maxillary sinusitis   [facility] [place], Megan P, DO   1 year ago Non-recurrent acute suppurative otitis media of both ears without spontaneous rupture of tympanic membranes   [facility] [place], [place], DO

## 2022-11-08 ENCOUNTER — Encounter: Payer: Self-pay | Admitting: Nurse Practitioner

## 2022-11-08 ENCOUNTER — Ambulatory Visit (INDEPENDENT_AMBULATORY_CARE_PROVIDER_SITE_OTHER): Payer: BC Managed Care – PPO | Admitting: Nurse Practitioner

## 2022-11-08 ENCOUNTER — Ambulatory Visit: Payer: BC Managed Care – PPO | Admitting: Physician Assistant

## 2022-11-08 VITALS — BP 137/67 | HR 80 | Temp 98.1°F | Ht 59.49 in | Wt 228.2 lb

## 2022-11-08 DIAGNOSIS — K921 Melena: Secondary | ICD-10-CM

## 2022-11-08 NOTE — Progress Notes (Signed)
BP 137/67   Pulse 80   Temp 98.1 F (36.7 C) (Oral)   Ht 4' 11.49" (1.511 m)   Wt 228 lb 3.2 oz (103.5 kg)   LMP  (LMP Unknown)   SpO2 98%   BMI 45.34 kg/m    Subjective:    Patient ID: Marissa Reynolds, female    DOB: 16-Dec-1965, 57 y.o.   MRN: [id number]  HPI: Marissa Reynolds is a 57 y.o. female  Chief Complaint  Patient presents with   Rectal Bleeding    Started getting worse 2 weeks ago   BLOOD in STOOL Patient presents to clinic with complaints of blood in her stool.  This has been going on sporadically for at least a year.  However, it has worsened recently and happens 90% of the time when she has a bowel movement.  She does have some pain.  Denies a history of Hemorrhoids.  She does strain some when she has a bowel movement.  States it is frank red blood in the toilet bowel.  States the whole toilet bowel is red when it happens.  On two occasions she has had clots.  She has not had a colonoscopy but did have Cologuard.       Relevant past medical, surgical, family and social history reviewed and updated as indicated. Interim medical history since our last visit reviewed. Allergies and medications reviewed and updated.  Review of Systems  Gastrointestinal:  Positive for blood in stool. Negative for abdominal pain, constipation and diarrhea.    Per HPI unless specifically indicated above     Objective:    BP 137/67   Pulse 80   Temp 98.1 F (36.7 C) (Oral)   Ht 4' 11.49" (1.511 m)   Wt 228 lb 3.2 oz (103.5 kg)   LMP  (LMP Unknown)   SpO2 98%   BMI 45.34 kg/m   Wt Readings from Last 3 Encounters:  11/08/22 228 lb 3.2 oz (103.5 kg)  07/06/22 226 lb 6.4 oz (102.7 kg)  12/20/21 217 lb 3.2 oz (98.5 kg)    Physical Exam Vitals and nursing note reviewed.  Constitutional:      General: She is not in acute distress.    Appearance: Normal appearance. She is normal weight. She is not ill-appearing, toxic-appearing or diaphoretic.  HENT:     Head: Normocephalic.      Right Ear: External ear normal.     Left Ear: External ear normal.     Nose: Nose normal.     Mouth/Throat:     Mouth: Mucous membranes are moist.     Pharynx: Oropharynx is clear.  Eyes:     General:        Right eye: No discharge.        Left eye: No discharge.     Extraocular Movements: Extraocular movements intact.     Conjunctiva/sclera: Conjunctivae normal.     Pupils: Pupils are equal, round, and reactive to light.  Cardiovascular:     Rate and Rhythm: Normal rate and regular rhythm.     Heart sounds: No murmur heard. Pulmonary:     Effort: Pulmonary effort is normal. No respiratory distress.     Breath sounds: Normal breath sounds. No wheezing or rales.  Genitourinary:   Musculoskeletal:     Cervical back: Normal range of motion and neck supple.  Skin:    General: Skin is warm and dry.     Capillary Refill: Capillary refill takes less than 2 seconds.  Neurological:     General: No focal deficit present.     Mental Status: She is alert and oriented to person, place, and time. Mental status is at baseline.  Psychiatric:        Mood and Affect: Mood normal.        Behavior: Behavior normal.        Thought Content: Thought content normal.        Judgment: Judgment normal.     Results for orders placed or performed in visit on 07/06/22  CBC with Differential/Platelet  Result Value Ref Range   WBC 8.1 3.4 - 10.8 x10E3/uL   RBC 4.34 3.77 - 5.28 x10E6/uL   Hemoglobin 13.9 11.1 - 15.9 g/dL   Hematocrit 40.7 34.0 - 46.6 %   MCV 94 79 - 97 fL   MCH 32.0 26.6 - 33.0 pg   MCHC 34.2 31.5 - 35.7 g/dL   RDW 12.4 11.7 - 15.4 %   Platelets 217 150 - 450 x10E3/uL   Neutrophils 56 Not Estab. %   Lymphs 34 Not Estab. %   Monocytes 6 Not Estab. %   Eos 3 Not Estab. %   Basos 1 Not Estab. %   Neutrophils Absolute 4.5 1.4 - 7.0 x10E3/uL   Lymphocytes Absolute 2.8 0.7 - 3.1 x10E3/uL   Monocytes Absolute 0.5 0.1 - 0.9 x10E3/uL   EOS (ABSOLUTE) 0.2 0.0 - 0.4 x10E3/uL    Basophils Absolute 0.1 0.0 - 0.2 x10E3/uL   Immature Granulocytes 0 Not Estab. %   Immature Grans (Abs) 0.0 0.0 - 0.1 x10E3/uL  Comprehensive metabolic panel  Result Value Ref Range   Glucose 134 (H) 70 - 99 mg/dL   BUN 16 6 - 24 mg/dL   Creatinine, Ser 0.82 0.57 - 1.00 mg/dL   eGFR 83 >59 mL/min/1.73   BUN/Creatinine Ratio 20 9 - 23   Sodium 143 134 - 144 mmol/L   Potassium 4.2 3.5 - 5.2 mmol/L   Chloride 103 96 - 106 mmol/L   CO2 24 20 - 29 mmol/L   Calcium 8.8 8.7 - 10.2 mg/dL   Total Protein 6.1 6.0 - 8.5 g/dL   Albumin 4.0 3.8 - 4.9 g/dL   Globulin, Total 2.1 1.5 - 4.5 g/dL   Albumin/Globulin Ratio 1.9 1.2 - 2.2   Bilirubin Total 0.4 0.0 - 1.2 mg/dL   Alkaline Phosphatase 37 (L) 44 - 121 IU/L   AST 32 0 - 40 IU/L   ALT 37 (H) 0 - 32 IU/L  Lipid Panel w/o Chol/HDL Ratio  Result Value Ref Range   Cholesterol, Total 170 100 - 199 mg/dL   Triglycerides 215 (H) 0 - 149 mg/dL   HDL 39 (L) >39 mg/dL   VLDL Cholesterol Cal 37 5 - 40 mg/dL   LDL Chol Calc (NIH) 94 0 - 99 mg/dL  TSH  Result Value Ref Range   TSH 2.630 0.450 - 4.500 uIU/mL      Assessment & Plan:   Problem List Items Addressed This Visit   None Visit Diagnoses     Blood in stool    -  Primary   Will obtain IFBOT stool test.  If positive will send to GI.  If negative will refer to general surgery for possible internal bleeding hemorroid.   Relevant Orders   Fecal occult blood, imunochemical        Follow up plan: Return if symptoms worsen or fail to improve.

## 2022-11-11 LAB — FECAL OCCULT BLOOD, IMMUNOCHEMICAL: Fecal Occult Bld: NEGATIVE

## 2022-11-13 NOTE — Progress Notes (Signed)
Hi Marissa Reynolds.  Your stool sample was negative which means the blood is not in your stool.  I do recommend you see someone about possible internal hemorrhoids.  It would be general surgery.  If you agree, I will place the referral.

## 2022-11-27 ENCOUNTER — Encounter: Payer: Self-pay | Admitting: Family Medicine

## 2022-11-27 ENCOUNTER — Telehealth (INDEPENDENT_AMBULATORY_CARE_PROVIDER_SITE_OTHER): Payer: BC Managed Care – PPO | Admitting: Family Medicine

## 2022-11-27 DIAGNOSIS — F338 Other recurrent depressive disorders: Secondary | ICD-10-CM | POA: Diagnosis not present

## 2022-11-27 MED ORDER — BUPROPION HCL ER (SR) 150 MG PO TB12: 300.0000 mg | ORAL_TABLET | Freq: Two times a day (BID) | ORAL | 3 refills | Status: DC

## 2022-11-27 NOTE — Assessment & Plan Note (Signed)
Not doing well. Had done better on BID wellbutrin. Will change to 353m short acting wellbutrin BID and recheck 2-3 weeks. Call with any concerns.

## 2022-11-27 NOTE — Progress Notes (Signed)
LMP  (LMP Unknown)    Subjective:    Patient ID: Marissa Reynolds, female    DOB: 1965/11/03, 57 y.o.   MRN: [id number]  HPI: Marissa Reynolds is a 57 y.o. female  Chief Complaint  Patient presents with   Depression    Patient says she would like to discuss the dose change in her Wellbutrin prescription. Patient says she felt like she did before starting medication yesterday.    DEPRESSION- had a bad day yesterday, had no motivation. Has not been taking care of the house Duration: chronic Mood status: exacerbated Satisfied with current treatment?: no Symptom severity: moderate  Duration of current treatment : chronic Side effects: no Medication compliance: good compliance Psychotherapy/counseling: no  Previous psychiatric medications: wellbutrin Depressed mood: yes Anxious mood: yes Anhedonia: no Significant weight loss or gain: no Insomnia: no  Fatigue: yes Feelings of worthlessness or guilt: yes Impaired concentration/indecisiveness: no Suicidal ideations: no Hopelessness: no Crying spells: no    11/27/2022    4:20 PM 11/08/2022    1:11 PM 08/03/2022   11:18 AM 07/06/2022    2:10 PM 12/20/2021    1:32 PM  Depression screen PHQ 2/9  Decreased Interest 1  0 1 0  Down, Depressed, Hopeless 1 0 0 1 0  PHQ - 2 Score 2 0 0 2 0  Altered sleeping 3 1 0 1 1  Tired, decreased energy 1 1 0 2 1  Change in appetite 0 0 0 3 1  Feeling bad or failure about yourself  0 0 0 0 0  Trouble concentrating 0 0 0 1 0  Moving slowly or fidgety/restless 0 0 0 0 0  Suicidal thoughts 0 0 0 0 0  PHQ-9 Score 6 2 0 9 3  Difficult doing work/chores Somewhat difficult Not difficult at all  Somewhat difficult     Relevant past medical, surgical, family and social history reviewed and updated as indicated. Interim medical history since our last visit reviewed. Allergies and medications reviewed and updated.  Review of Systems  Constitutional: Negative.   Respiratory: Negative.    Cardiovascular:  Negative.   Gastrointestinal: Negative.   Musculoskeletal: Negative.   Psychiatric/Behavioral:  Positive for dysphoric mood. Negative for agitation, behavioral problems, confusion, decreased concentration, hallucinations, self-injury, sleep disturbance and suicidal ideas. The patient is nervous/anxious. The patient is not hyperactive.     Per HPI unless specifically indicated above     Objective:    LMP  (LMP Unknown)   Wt Readings from Last 3 Encounters:  11/08/22 228 lb 3.2 oz (103.5 kg)  07/06/22 226 lb 6.4 oz (102.7 kg)  12/20/21 217 lb 3.2 oz (98.5 kg)    Physical Exam Vitals and nursing note reviewed.  Constitutional:      General: She is not in acute distress.    Appearance: Normal appearance. She is obese. She is not ill-appearing, toxic-appearing or diaphoretic.  HENT:     Head: Normocephalic and atraumatic.     Right Ear: External ear normal.     Left Ear: External ear normal.     Nose: Nose normal.     Mouth/Throat:     Mouth: Mucous membranes are moist.     Pharynx: Oropharynx is clear.  Eyes:     General: No scleral icterus.       Right eye: No discharge.        Left eye: No discharge.     Conjunctiva/sclera: Conjunctivae normal.     Pupils: Pupils are equal,  round, and reactive to light.  Pulmonary:     Effort: Pulmonary effort is normal. No respiratory distress.     Comments: Speaking in full sentences Musculoskeletal:        General: Normal range of motion.     Cervical back: Normal range of motion.  Skin:    Coloration: Skin is not jaundiced or pale.     Findings: No bruising, erythema, lesion or rash.  Neurological:     Mental Status: She is alert and oriented to person, place, and time. Mental status is at baseline.  Psychiatric:        Mood and Affect: Mood normal.        Behavior: Behavior normal.        Thought Content: Thought content normal.        Judgment: Judgment normal.     Results for orders placed or performed in visit on 11/08/22   Fecal occult blood, imunochemical   Specimen: Stool   ST  Result Value Ref Range   Fecal Occult Bld Negative Negative      Assessment & Plan:   Problem List Items Addressed This Visit       Other   Seasonal affective disorder ([place]) - Primary    Not doing well. Had done better on BID wellbutrin. Will change to 334m short acting wellbutrin BID and recheck 2-3 weeks. Call with any concerns.       Relevant Medications   buPROPion (WELLBUTRIN SR) 150 MG 12 hr tablet     Follow up plan: Return 2-3 weeks, virtual OK.   This visit was completed via video visit through MyChart due to the restrictions of the COVID-19 pandemic. All issues as above were discussed and addressed. Physical exam was done as above through visual confirmation on video through MyChart. If it was felt that the patient should be evaluated in the office, they were directed there. The patient verbally consented to this visit. Location of the patient: parking lot Location of the provider: work Those involved with this call:  Provider: MPark Liter DO CMA: DIrena Reichmann CErwinDesk/Registration: [employer] Time spent on call:  15 minutes with patient face to face via video conference. More than 50% of this time was spent in counseling and coordination of care. 23 minutes total spent in review of patient's record and preparation of their chart.

## 2022-11-27 NOTE — Telephone Encounter (Signed)
Virtual please, OK to double book today if possible

## 2022-11-28 ENCOUNTER — Telehealth: Payer: Self-pay | Admitting: Family Medicine

## 2022-11-28 ENCOUNTER — Ambulatory Visit: Payer: BC Managed Care – PPO | Admitting: Family Medicine

## 2022-11-28 NOTE — Telephone Encounter (Signed)
Pt stated her medication buPROPion (WELLBUTRIN SR) 150 MG 12 hr tablet was not filled based on the instructions that were given.  Please advise.

## 2022-11-28 NOTE — Telephone Encounter (Signed)
Contacted [facility] per Dr. Wynetta Emery. Clarified prescription dose with the pharmacy and Dr. Wynetta Emery verbally.

## 2022-11-28 NOTE — Telephone Encounter (Signed)
Copied from [place]. Topic: General - Other >> Nov 27, 2022  5:08 PM Sabas Sous wrote: Reason for CRM: Estill Bamberg called from the pharmacy needing dose confirmation for the wellbutrin that was called in today

## 2022-11-28 NOTE — Progress Notes (Signed)
Called patient to schedule follow up for 2-3 weeks. Left detailed message advised to call the office to schedule.

## 2022-12-11 ENCOUNTER — Telehealth (INDEPENDENT_AMBULATORY_CARE_PROVIDER_SITE_OTHER): Payer: BC Managed Care – PPO | Admitting: Family Medicine

## 2022-12-11 ENCOUNTER — Encounter: Payer: Self-pay | Admitting: Family Medicine

## 2022-12-11 DIAGNOSIS — F338 Other recurrent depressive disorders: Secondary | ICD-10-CM | POA: Diagnosis not present

## 2022-12-11 MED ORDER — BUPROPION HCL ER (SR) 150 MG PO TB12: 300.0000 mg | ORAL_TABLET | Freq: Two times a day (BID) | ORAL | 1 refills | Status: DC

## 2022-12-11 NOTE — Progress Notes (Signed)
LMP  (LMP Unknown)    Subjective:    Patient ID: Marissa Reynolds, female    DOB: 20-Jun-1966, 57 y.o.   MRN: [id number]  HPI: Marissa Reynolds is a 57 y.o. female  No chief complaint on file.  DEPRESSION Mood status: better Satisfied with current treatment?: yes Symptom severity: mild  Duration of current treatment : chronic Side effects: no Medication compliance: excellent compliance Psychotherapy/counseling: no  Previous psychiatric medications: wellbutrin Depressed mood: no Anxious mood: no Anhedonia: no Significant weight loss or gain: no Insomnia: no  Fatigue: no Feelings of worthlessness or guilt: no Impaired concentration/indecisiveness: no Suicidal ideations: no Hopelessness: no Crying spells: no    12/11/2022    4:14 PM 11/27/2022    4:20 PM 11/08/2022    1:11 PM 08/03/2022   11:18 AM 07/06/2022    2:10 PM  Depression screen PHQ 2/9  Decreased Interest 0 1  0 1  Down, Depressed, Hopeless 0 1 0 0 1  PHQ - 2 Score 0 2 0 0 2  Altered sleeping 0 3 1 0 1  Tired, decreased energy 0 1 1 0 2  Change in appetite 0 0 0 0 3  Feeling bad or failure about yourself  0 0 0 0 0  Trouble concentrating 0 0 0 0 1  Moving slowly or fidgety/restless 0 0 0 0 0  Suicidal thoughts 0 0 0 0 0  PHQ-9 Score 0 6 2 0 9  Difficult doing work/chores Not difficult at all Somewhat difficult Not difficult at all  Somewhat difficult      11/27/2022    4:23 PM 11/08/2022    1:12 PM 08/03/2022   11:19 AM 07/06/2022    2:11 PM  GAD 7 : Generalized Anxiety Score  Nervous, Anxious, on Edge 0 0 0 0  Control/stop worrying 0 0 0 0  Worry too much - different things 0 0 0 0  Trouble relaxing 3 0 0 0  Restless 0 0 0 0  Easily annoyed or irritable 0 0 0 0  Afraid - awful might happen 0 0 0 0  Total GAD 7 Score 3 0 0 0  Anxiety Difficulty Somewhat difficult      Relevant past medical, surgical, family and social history reviewed and updated as indicated. Interim medical history since our last visit  reviewed. Allergies and medications reviewed and updated.  Review of Systems  Constitutional: Negative.   Respiratory: Negative.    Cardiovascular: Negative.   Musculoskeletal: Negative.   Neurological: Negative.   Psychiatric/Behavioral: Negative.      Per HPI unless specifically indicated above     Objective:    LMP  (LMP Unknown)   Wt Readings from Last 3 Encounters:  11/08/22 228 lb 3.2 oz (103.5 kg)  07/06/22 226 lb 6.4 oz (102.7 kg)  12/20/21 217 lb 3.2 oz (98.5 kg)    Physical Exam Vitals and nursing note reviewed.  Constitutional:      General: She is not in acute distress.    Appearance: Normal appearance. She is obese. She is not ill-appearing, toxic-appearing or diaphoretic.  HENT:     Head: Normocephalic and atraumatic.     Right Ear: External ear normal.     Left Ear: External ear normal.     Nose: Nose normal.     Mouth/Throat:     Mouth: Mucous membranes are moist.     Pharynx: Oropharynx is clear.  Eyes:     General: No scleral icterus.  Right eye: No discharge.        Left eye: No discharge.     Conjunctiva/sclera: Conjunctivae normal.     Pupils: Pupils are equal, round, and reactive to light.  Pulmonary:     Effort: Pulmonary effort is normal. No respiratory distress.     Comments: Speaking in full sentences Musculoskeletal:        General: Normal range of motion.     Cervical back: Normal range of motion.  Skin:    Coloration: Skin is not jaundiced or pale.     Findings: No bruising, erythema, lesion or rash.  Neurological:     Mental Status: She is alert and oriented to person, place, and time. Mental status is at baseline.  Psychiatric:        Mood and Affect: Mood normal.        Behavior: Behavior normal.        Thought Content: Thought content normal.        Judgment: Judgment normal.     Results for orders placed or performed in visit on 11/08/22  Fecal occult blood, imunochemical   Specimen: Stool   ST  Result Value Ref  Range   Fecal Occult Bld Negative Negative      Assessment & Plan:   Problem List Items Addressed This Visit       Other   Seasonal affective disorder ([place]) - Primary    Under good control on current regimen. Continue current regimen. Continue to monitor. Call with any concerns. Refills given.        Relevant Medications   buPROPion (WELLBUTRIN SR) 150 MG 12 hr tablet     Follow up plan: Return in about 6 weeks (around 01/22/2023) for virtual OK.   This visit was completed via video visit through MyChart due to the restrictions of the COVID-19 pandemic. All issues as above were discussed and addressed. Physical exam was done as above through visual confirmation on video through MyChart. If it was felt that the patient should be evaluated in the office, they were directed there. The patient verbally consented to this visit. Location of the patient: parking lot Location of the provider: work Those involved with this call:  Provider: Park Liter, DO CMA: Irena Reichmann, [place] Desk/Registration: Leota Jacobsen  Time spent on call:  15 minutes with patient face to face via video conference. More than 50% of this time was spent in counseling and coordination of care. 23 minutes total spent in review of patient's record and preparation of their chart.

## 2022-12-11 NOTE — Assessment & Plan Note (Signed)
Under good control on current regimen. Continue current regimen. Continue to monitor. Call with any concerns. Refills given.   

## 2023-01-23 ENCOUNTER — Encounter: Payer: Self-pay | Admitting: Family Medicine

## 2023-01-23 ENCOUNTER — Telehealth (INDEPENDENT_AMBULATORY_CARE_PROVIDER_SITE_OTHER): Payer: BC Managed Care – PPO | Admitting: Family Medicine

## 2023-01-23 VITALS — BP 142/79 | HR 79

## 2023-01-23 DIAGNOSIS — F338 Other recurrent depressive disorders: Secondary | ICD-10-CM | POA: Diagnosis not present

## 2023-01-23 DIAGNOSIS — K649 Unspecified hemorrhoids: Secondary | ICD-10-CM

## 2023-01-23 NOTE — Assessment & Plan Note (Signed)
Doing great on her medicine. Doesn't want to stop it for the summer. Continue current regimen. Continue to monitor. Call with any concerns. Recheck at physical in 5 months.

## 2023-01-23 NOTE — Progress Notes (Signed)
BP (!) 142/79   Pulse 79   LMP  (LMP Unknown)   SpO2 98%    Subjective:    Patient ID: Marissa Reynolds, female    DOB: 10-18-1965, 57 y.o.   MRN: [id number]  HPI: Marissa Reynolds is a 57 y.o. female  Chief Complaint  Patient presents with   Sleeping Problem   Depression   DEPRESSION Mood status: controlled Satisfied with current treatment?: yes Symptom severity: mild  Duration of current treatment : chronic Side effects: no Medication compliance: excellent compliance Psychotherapy/counseling: no  Previous psychiatric medications: wellbutrin Depressed mood: no Anxious mood: no Anhedonia: no Significant weight loss or gain: no Insomnia: no  Fatigue: no Feelings of worthlessness or guilt: no Impaired concentration/indecisiveness: no Suicidal ideations: no Hopelessness: no Crying spells: no    01/23/2023    4:24 PM 12/11/2022    4:14 PM 11/27/2022    4:20 PM 11/08/2022    1:11 PM 08/03/2022   11:18 AM  Depression screen PHQ 2/9  Decreased Interest 0 0 1  0  Down, Depressed, Hopeless 0 0 1 0 0  PHQ - 2 Score 0 0 2 0 0  Altered sleeping 0 0 3 1 0  Tired, decreased energy 0 0 1 1 0  Change in appetite 0 0 0 0 0  Feeling bad or failure about yourself  0 0 0 0 0  Trouble concentrating 0 0 0 0 0  Moving slowly or fidgety/restless 0 0 0 0 0  Suicidal thoughts 0 0 0 0 0  PHQ-9 Score 0 0 6 2 0  Difficult doing work/chores Not difficult at all Not difficult at all Somewhat difficult Not difficult at all    HEMORRHOIDS Duration: months Anal fullness: yes Perianal itching/irritation: yes Perianal pain: no Bright red rectal bleeding: yes Amount of blood: moderate Frequency: intermittent Constipation: no Hard stools: no Chronic straining/valsava: no Status: fluctuating Treatments attempted: hydrocortisone cream  Previous hemorrhoids: yes  Colonoscopy: no   Relevant past medical, surgical, family and social history reviewed and updated as indicated. Interim medical  history since our last visit reviewed. Allergies and medications reviewed and updated.  Review of Systems  Constitutional: Negative.   Respiratory: Negative.    Cardiovascular: Negative.   Gastrointestinal: Negative.   Neurological: Negative.   Psychiatric/Behavioral: Negative.      Per HPI unless specifically indicated above     Objective:    BP (!) 142/79   Pulse 79   LMP  (LMP Unknown)   SpO2 98%   Wt Readings from Last 3 Encounters:  11/08/22 228 lb 3.2 oz (103.5 kg)  07/06/22 226 lb 6.4 oz (102.7 kg)  12/20/21 217 lb 3.2 oz (98.5 kg)    Physical Exam Vitals and nursing note reviewed.  Constitutional:      General: She is not in acute distress.    Appearance: Normal appearance. She is obese. She is not ill-appearing, toxic-appearing or diaphoretic.  HENT:     Head: Normocephalic and atraumatic.     Right Ear: External ear normal.     Left Ear: External ear normal.     Nose: Nose normal.     Mouth/Throat:     Mouth: Mucous membranes are moist.     Pharynx: Oropharynx is clear.  Eyes:     General: No scleral icterus.       Right eye: No discharge.        Left eye: No discharge.     Conjunctiva/sclera: Conjunctivae normal.  Pupils: Pupils are equal, round, and reactive to light.  Pulmonary:     Effort: Pulmonary effort is normal. No respiratory distress.     Comments: Speaking in full sentences Musculoskeletal:        General: Normal range of motion.     Cervical back: Normal range of motion.  Skin:    Coloration: Skin is not jaundiced or pale.     Findings: No bruising, erythema, lesion or rash.  Neurological:     Mental Status: She is alert and oriented to person, place, and time. Mental status is at baseline.  Psychiatric:        Mood and Affect: Mood normal.        Behavior: Behavior normal.        Thought Content: Thought content normal.        Judgment: Judgment normal.     Results for orders placed or performed in visit on 11/08/22  Fecal  occult blood, imunochemical   Specimen: Stool   ST  Result Value Ref Range   Fecal Occult Bld Negative Negative      Assessment & Plan:   Problem List Items Addressed This Visit       Other   Seasonal affective disorder - Primary    Doing great on her medicine. Doesn't want to stop it for the summer. Continue current regimen. Continue to monitor. Call with any concerns. Recheck at physical in 5 months.       Other Visit Diagnoses     Hemorrhoids, unspecified hemorrhoid type       Would like to discuss banding. Referral to GI placed today.   Relevant Orders   Ambulatory referral to Gastroenterology        Follow up plan: Return in about 5 months (around 06/25/2023) for physical.   This visit was completed via video visit through MyChart due to the restrictions of the COVID-19 pandemic. All issues as above were discussed and addressed. Physical exam was done as above through visual confirmation on video through MyChart. If it was felt that the patient should be evaluated in the office, they were directed there. The patient verbally consented to this visit. Location of the patient: home Location of the provider: work Those involved with this call:  Provider: Olevia Perches, DO CMA: Malen Gauze, CMA Front Desk/Registration: Ozella Almond  Time spent on call:  15 minutes with patient face to face via video conference. More than 50% of this time was spent in counseling and coordination of care. 23 minutes total spent in review of patient's record and preparation of their chart.

## 2023-01-24 ENCOUNTER — Other Ambulatory Visit: Payer: Self-pay | Admitting: Family Medicine

## 2023-01-24 DIAGNOSIS — Z1231 Encounter for screening mammogram for malignant neoplasm of breast: Secondary | ICD-10-CM

## 2023-01-24 NOTE — Progress Notes (Signed)
Attemped to reach patient to get scheduled for 5 month physical, voicemail was full not able to leave message.  Also put in CRM.

## 2023-02-20 ENCOUNTER — Ambulatory Visit
Admission: RE | Admit: 2023-02-20 | Discharge: 2023-02-20 | Disposition: A | Discharge status: Home / Self Care | Payer: BC Managed Care – PPO | Source: Ambulatory Visit | Attending: Family Medicine | Admitting: Family Medicine

## 2023-02-20 DIAGNOSIS — Z1231 Encounter for screening mammogram for malignant neoplasm of breast: Secondary | ICD-10-CM | POA: Insufficient documentation

## 2023-04-23 ENCOUNTER — Telehealth: Payer: Self-pay

## 2023-04-23 NOTE — Telephone Encounter (Signed)
Fax received regarding Bupropion ER/SR 150 MG. Insurance won't cover more than 2 tablets per day.increase the strength or PA?

## 2023-04-24 NOTE — Telephone Encounter (Signed)
Please try PA- she's been on this dose a long time. There is not a 300mg  dose of this medicine. Please let patient know that insurance is not covering it. I can change her to 300 Extended release or she can pay out of pocket if she wants to stay on current dose.

## 2023-04-24 NOTE — Telephone Encounter (Signed)
Called and spoke with the patient. She states that she would like to stay on her current dose and doesn't mind paying out of pocket for it.

## 2023-05-28 ENCOUNTER — Ambulatory Visit: Payer: BC Managed Care – PPO | Admitting: Gastroenterology

## 2023-06-26 ENCOUNTER — Encounter: Payer: BC Managed Care – PPO | Admitting: Family Medicine

## 2023-08-09 ENCOUNTER — Encounter: Payer: BC Managed Care – PPO | Admitting: Family Medicine

## 2023-09-15 ENCOUNTER — Other Ambulatory Visit: Payer: Self-pay | Admitting: Family Medicine

## 2023-09-18 NOTE — Telephone Encounter (Signed)
Requested medication (s) are due for refill today: yes  Requested medication (s) are on the active medication list: yes  Last refill:  12/11/22 #360/1  Future visit scheduled: no  Notes to clinic:  Unable to refill per protocol due to failed labs, no updated results.      Requested Prescriptions  Pending Prescriptions Disp Refills   buPROPion (WELLBUTRIN SR) 150 MG 12 hr tablet [Pharmacy Med Name: buPROPion HCl ER (SR) 150 MG Oral Tablet Extended Release 12 Hour] 360 tablet 0    Sig: Take 2 tablets by mouth twice daily     Psychiatry: Antidepressants - bupropion Failed - 09/15/2023  6:56 AM      Failed - Cr in normal range and within 360 days    Creatinine, Ser  Date Value Ref Range Status  07/06/2022 0.82 0.57 - 1.00 mg/dL Final         Failed - AST in normal range and within 360 days    AST  Date Value Ref Range Status  07/06/2022 32 0 - 40 IU/L Final         Failed - ALT in normal range and within 360 days    ALT  Date Value Ref Range Status  07/06/2022 37 (H) 0 - 32 IU/L Final         Failed - Last BP in normal range    BP Readings from Last 1 Encounters:  01/23/23 (!) 142/79         Failed - Valid encounter within last 6 months    Recent Outpatient Visits           7 months ago Seasonal affective disorder [facility])   [place] [facility] [place], Megan P, DO   9 months ago Seasonal affective disorder [facility])   [facility] [place] [place], Megan P, DO   9 months ago Seasonal affective disorder [facility])   [facility] [place], Megan P, DO   10 months ago Blood in stool   [place] [facility] Larae Grooms, NP   1 year ago Seasonal affective disorder [facility])   [facility], Megan P, DO              Passed - Completed PHQ-2 or PHQ-9 in the last 360 days

## 2023-09-18 NOTE — Telephone Encounter (Signed)
Patient is overdue for an appointment. Please call to schedule and then route to provider for refill.  

## 2023-09-18 NOTE — Telephone Encounter (Signed)
Attempted to reach patient, LVM to call office back to schedule appointment to be receive refills.  Put in CRM.

## 2023-09-19 NOTE — Telephone Encounter (Signed)
Attempted to reach x2 more times with no success.  Will schedule an appointment and send her an appointment reminder.

## 2023-09-19 NOTE — Telephone Encounter (Signed)
Scheduled patient on 10/18/2023 @ 3:20 pm.

## 2023-09-21 ENCOUNTER — Other Ambulatory Visit: Payer: Self-pay | Admitting: Family Medicine

## 2023-10-18 ENCOUNTER — Ambulatory Visit: Payer: BC Managed Care – PPO | Admitting: Family Medicine

## 2023-10-18 ENCOUNTER — Other Ambulatory Visit (HOSPITAL_COMMUNITY)
Admission: RE | Admit: 2023-10-18 | Discharge: 2023-10-18 | Disposition: A | Discharge status: Home / Self Care | Payer: BC Managed Care – PPO | Source: Ambulatory Visit | Attending: Family Medicine | Admitting: Family Medicine

## 2023-10-18 VITALS — BP 148/74 | HR 75 | Wt 212.4 lb

## 2023-10-18 DIAGNOSIS — Z0000 Encounter for general adult medical examination without abnormal findings: Secondary | ICD-10-CM | POA: Insufficient documentation

## 2023-10-18 DIAGNOSIS — F338 Other recurrent depressive disorders: Secondary | ICD-10-CM

## 2023-10-18 DIAGNOSIS — E782 Mixed hyperlipidemia: Secondary | ICD-10-CM | POA: Diagnosis not present

## 2023-10-18 MED ORDER — BUPROPION HCL ER (SR) 150 MG PO TB12: 300.0000 mg | ORAL_TABLET | Freq: Two times a day (BID) | ORAL | 1 refills | Status: DC

## 2023-10-18 NOTE — Assessment & Plan Note (Signed)
 Under good control on current regimen. Continue current regimen. Continue to monitor. Call with any concerns. Refills given.

## 2023-10-18 NOTE — Assessment & Plan Note (Signed)
 Rechecking labs today. Await results. Treat as needed.

## 2023-10-18 NOTE — Progress Notes (Signed)
 BP (!) 148/74   Pulse 75   Wt 212 lb 6.4 oz (96.3 kg)   LMP  (LMP Unknown)   SpO2 98%   BMI 42.20 kg/m    Subjective:    Patient ID: Marissa Reynolds, female    DOB: 11/16/1965, 58 y.o.   MRN: [id number]  HPI: Marissa Reynolds is a 58 y.o. female presenting on 10/18/2023 for comprehensive medical examination. Current medical complaints include:  DEPRESSION Mood status: controlled Satisfied with current treatment?: yes Symptom severity: mild  Duration of current treatment : months Side effects: no Medication compliance: excellent compliance Psychotherapy/counseling: no  Previous psychiatric medications: wellbutrin  Depressed mood: no Anxious mood: no Anhedonia: no Significant weight loss or gain: no Insomnia: no  Fatigue: no Feelings of worthlessness or guilt: no Impaired concentration/indecisiveness: no Suicidal ideations: no Hopelessness: no Crying spells: no    10/18/2023    3:31 PM 01/23/2023    4:24 PM 12/11/2022    4:14 PM 11/27/2022    4:20 PM 11/08/2022    1:11 PM  Depression screen PHQ 2/9  Decreased Interest 0 0 0 1   Down, Depressed, Hopeless 0 0 0 1 0  PHQ - 2 Score 0 0 0 2 0  Altered sleeping 0 0 0 3 1  Tired, decreased energy 0 0 0 1 1  Change in appetite 0 0 0 0 0  Feeling bad or failure about yourself  0 0 0 0 0  Trouble concentrating 0 0 0 0 0  Moving slowly or fidgety/restless 0 0 0 0 0  Suicidal thoughts 0 0 0 0 0  PHQ-9 Score 0 0 0 6 2  Difficult doing work/chores Not difficult at all Not difficult at all Not difficult at all Somewhat difficult Not difficult at all    She currently lives with: husband Menopausal Symptoms: no  Depression Screen done today and results listed below:     10/18/2023    3:31 PM 01/23/2023    4:24 PM 12/11/2022    4:14 PM 11/27/2022    4:20 PM 11/08/2022    1:11 PM  Depression screen PHQ 2/9  Decreased Interest 0 0 0 1   Down, Depressed, Hopeless 0 0 0 1 0  PHQ - 2 Score 0 0 0 2 0  Altered sleeping 0 0 0 3 1  Tired,  decreased energy 0 0 0 1 1  Change in appetite 0 0 0 0 0  Feeling bad or failure about yourself  0 0 0 0 0  Trouble concentrating 0 0 0 0 0  Moving slowly or fidgety/restless 0 0 0 0 0  Suicidal thoughts 0 0 0 0 0  PHQ-9 Score 0 0 0 6 2  Difficult doing work/chores Not difficult at all Not difficult at all Not difficult at all Somewhat difficult Not difficult at all    Past Medical History:  Past Medical History:  Diagnosis Date   Asthma     Surgical History:  Past Surgical History:  Procedure Laterality Date   CESAREAN SECTION      Medications:  Current Outpatient Medications on File Prior to Visit  Medication Sig   albuterol (PROVENTIL HFA;VENTOLIN HFA) 108 (90 BASE) MCG/ACT inhaler Inhale 2 puffs into the lungs every 6 (six) hours as needed for wheezing or shortness of breath.   Biotin 1000 MCG tablet Take 1,000 mcg by mouth 3 (three) times daily.   ferrous sulfate 325 (65 FE) MG tablet Take 325 mg by mouth daily with breakfast.  glucosamine-chondroitin 500-400 MG tablet Take 1 tablet by mouth daily.   loratadine (CLARITIN) 10 MG tablet Take 10 mg by mouth daily.   melatonin 5 MG TABS Take 15 mg by mouth at bedtime.   Multiple Vitamin (MULTIVITAMIN) tablet Take 1 tablet by mouth daily.   naproxen sodium (ALEVE) 220 MG tablet Take 220 mg by mouth daily as needed (2 tabs daily).   OVER THE COUNTER MEDICATION Vitamin B 6   Potassium 75 MG TABS Take by mouth.   fluticasone-salmeterol (ADVAIR HFA) 115-21 MCG/ACT inhaler Inhale into the lungs.   No current facility-administered medications on file prior to visit.    Allergies:  No Known Allergies  Social History:  Social History   Socioeconomic History   Marital status: Married    Spouse name: Not on file   Number of children: Not on file   Years of education: Not on file   Highest education level: Associate degree: academic program  Occupational History   Not on file  Tobacco Use   Smoking status: Never    Smokeless tobacco: Never  Vaping Use   Vaping status: Never Used  Substance and Sexual Activity   Alcohol use: No   Drug use: No   Sexual activity: Yes    Birth control/protection: Post-menopausal  Other Topics Concern   Not on file  Social History Narrative   Not on file   Social Drivers of Health   Financial Resource Strain: Low Risk  (10/18/2023)   Overall Financial Resource Strain (CARDIA)    Difficulty of Paying Living Expenses: Not very hard  Food Insecurity: No Food Insecurity (10/18/2023)   Hunger Vital Sign    Worried About Running Out of Food in the Last Year: Never true    Ran Out of Food in the Last Year: Never true  Transportation Needs: No Transportation Needs (10/18/2023)   PRAPARE - Administrator, Civil Service (Medical): No    Lack of Transportation (Non-Medical): No  Physical Activity: Unknown (10/18/2023)   Exercise Vital Sign    Days of Exercise per Week: 0 days    Minutes of Exercise per Session: Not on file  Stress: No Stress Concern Present (10/18/2023)   [employer] of Occupational Health - Occupational Stress Questionnaire    Feeling of Stress : Not at all  Social Connections: Moderately Integrated (10/18/2023)   Social Connection and Isolation Panel [NHANES]    Frequency of Communication with Friends and Family: Three times a week    Frequency of Social Gatherings with Friends and Family: Once a week    Attends Religious Services: More than 4 times per year    Active Member of [employer] or Organizations: No    Attends Engineer, Structural: Not on file    Marital Status: Married  Catering Manager Violence: Not on file   Social History   Tobacco Use  Smoking Status Never  Smokeless Tobacco Never   Social History   Substance and Sexual Activity  Alcohol Use No    Family History:  Family History  Problem Relation Age of Onset   Anemia Mother        RAEB-1   Other Mother        RAEB 1   Heart attack Father    Kidney  disease Father    Stroke Maternal Grandmother    Diabetes Maternal Grandfather    Breast cancer Cousin    Thyroid  cancer Maternal Uncle     Past medical history,  surgical history, medications, allergies, family history and social history reviewed with patient today and changes made to appropriate areas of the chart.   Review of Systems  Constitutional: Negative.   HENT: Negative.    Eyes: Negative.   Respiratory: Negative.    Cardiovascular: Negative.   Gastrointestinal: Negative.   Genitourinary: Negative.   Musculoskeletal: Negative.   Skin: Negative.   Neurological: Negative.   Endo/Heme/Allergies:  Negative for environmental allergies and polydipsia. Bruises/bleeds easily.  Psychiatric/Behavioral: Negative.     All other ROS negative except what is listed above and in the HPI.      Objective:    BP (!) 148/74   Pulse 75   Wt 212 lb 6.4 oz (96.3 kg)   LMP  (LMP Unknown)   SpO2 98%   BMI 42.20 kg/m   Wt Readings from Last 3 Encounters:  10/18/23 212 lb 6.4 oz (96.3 kg)  11/08/22 228 lb 3.2 oz (103.5 kg)  07/06/22 226 lb 6.4 oz (102.7 kg)    Physical Exam Vitals and nursing note reviewed. Exam conducted with a chaperone present.  Constitutional:      General: She is not in acute distress.    Appearance: Normal appearance. She is not ill-appearing, toxic-appearing or diaphoretic.  HENT:     Head: Normocephalic and atraumatic.     Right Ear: Tympanic membrane, ear canal and external ear normal. There is no impacted cerumen.     Left Ear: Tympanic membrane, ear canal and external ear normal. There is no impacted cerumen.     Nose: Nose normal. No congestion or rhinorrhea.     Mouth/Throat:     Mouth: Mucous membranes are moist.     Pharynx: Oropharynx is clear. No oropharyngeal exudate or posterior oropharyngeal erythema.  Eyes:     General: No scleral icterus.       Right eye: No discharge.        Left eye: No discharge.     Extraocular Movements: Extraocular  movements intact.     Conjunctiva/sclera: Conjunctivae normal.     Pupils: Pupils are equal, round, and reactive to light.  Neck:     Vascular: No carotid bruit.  Cardiovascular:     Rate and Rhythm: Normal rate and regular rhythm.     Pulses: Normal pulses.     Heart sounds: No murmur heard.    No friction rub. No gallop.  Pulmonary:     Effort: Pulmonary effort is normal. No respiratory distress.     Breath sounds: Normal breath sounds. No stridor. No wheezing, rhonchi or rales.  Chest:     Chest wall: No tenderness.  Abdominal:     General: Abdomen is flat. Bowel sounds are normal. There is no distension.     Palpations: Abdomen is soft. There is no mass.     Tenderness: There is no abdominal tenderness. There is no right CVA tenderness, left CVA tenderness, guarding or rebound.     Hernia: No hernia is present.  Genitourinary:    Labia:        Right: No rash, tenderness, lesion or injury.        Left: No rash, tenderness, lesion or injury.      Vagina: Normal.     Cervix: Normal.     Adnexa: Right adnexa normal and left adnexa normal.     Comments: Breast exams deferred with shared decision making Musculoskeletal:        General: No swelling, tenderness, deformity or signs of  injury.     Cervical back: Normal range of motion and neck supple. No rigidity. No muscular tenderness.     Right lower leg: No edema.     Left lower leg: No edema.  Lymphadenopathy:     Cervical: No cervical adenopathy.  Skin:    General: Skin is warm and dry.     Capillary Refill: Capillary refill takes less than 2 seconds.     Coloration: Skin is not jaundiced or pale.     Findings: No bruising, erythema, lesion or rash.  Neurological:     General: No focal deficit present.     Mental Status: She is alert and oriented to person, place, and time. Mental status is at baseline.     Cranial Nerves: No cranial nerve deficit.     Sensory: No sensory deficit.     Motor: No weakness.      Coordination: Coordination normal.     Gait: Gait normal.     Deep Tendon Reflexes: Reflexes normal.  Psychiatric:        Mood and Affect: Mood normal.        Behavior: Behavior normal.        Thought Content: Thought content normal.        Judgment: Judgment normal.     Results for orders placed or performed in visit on 11/08/22  Fecal occult blood, imunochemical   Collection Time: 11/09/22  4:43 PM   Specimen: Stool   ST  Result Value Ref Range   Fecal Occult Bld Negative Negative      Assessment & Plan:   Problem List Items Addressed This Visit       Other   Hyperlipidemia   Rechecking labs today. Await results. Treat as needed.       Seasonal affective disorder (HCC)   Under good control on current regimen. Continue current regimen. Continue to monitor. Call with any concerns. Refills given.        Relevant Medications   buPROPion  (WELLBUTRIN  SR) 150 MG 12 hr tablet   Other Visit Diagnoses       Routine general medical examination at a health care facility    -  Primary   Vaccines up to date/declined. Screening labs checked today. Pap done. Mammo and colonoscopy up to date. Continue diet and exercise. Call with any concerns.   Relevant Orders   CBC with Differential/Platelet   Comprehensive metabolic panel   Lipid Panel w/o Chol/HDL Ratio   Cytology - PAP   TSH        Follow up plan: Return in about 6 months (around 04/16/2024).   LABORATORY TESTING:  - Pap smear: pap done  IMMUNIZATIONS:   - Tdap: Tetanus vaccination status reviewed: last tetanus booster within 10 years. - Influenza: Refused - Pneumovax: Not applicable - Prevnar: Not applicable - COVID: Refused - HPV: Not applicable - Shingrix vaccine: Refused  SCREENING: -Mammogram: Up to date  - Colonoscopy: Up to date   PATIENT COUNSELING:   Advised to take 1 mg of folate supplement per day if capable of pregnancy.   Sexuality: Discussed sexually transmitted diseases, partner selection,  use of condoms, avoidance of unintended pregnancy  and contraceptive alternatives.   Advised to avoid cigarette smoking.  I discussed with the patient that most people either abstain from alcohol or drink within safe limits (<=14/week and <=4 drinks/occasion for males, <=7/weeks and <= 3 drinks/occasion for females) and that the risk for alcohol disorders and other health effects rises  proportionally with the number of drinks per week and how often a drinker exceeds daily limits.  Discussed cessation/primary prevention of drug use and availability of treatment for abuse.   Diet: Encouraged to adjust caloric intake to maintain  or achieve ideal body weight, to reduce intake of dietary saturated fat and total fat, to limit sodium intake by avoiding high sodium foods and not adding table salt, and to maintain adequate dietary potassium and calcium preferably from fresh fruits, vegetables, and low-fat dairy products.    stressed the importance of regular exercise  Injury prevention: Discussed safety belts, safety helmets, smoke detector, smoking near bedding or upholstery.   Dental health: Discussed importance of regular tooth brushing, flossing, and dental visits.    NEXT PREVENTATIVE PHYSICAL DUE IN 1 YEAR. Return in about 6 months (around 04/16/2024).

## 2023-10-19 LAB — CBC WITH DIFFERENTIAL/PLATELET
Basophils Absolute: 0.1 10*3/uL (ref 0.0–0.2)
Basos: 1 %
EOS (ABSOLUTE): 0.2 10*3/uL (ref 0.0–0.4)
Eos: 3 %
Hematocrit: 41.6 % (ref 34.0–46.6)
Hemoglobin: 13.7 g/dL (ref 11.1–15.9)
Immature Grans (Abs): 0 10*3/uL (ref 0.0–0.1)
Immature Granulocytes: 0 %
Lymphocytes Absolute: 2.7 10*3/uL (ref 0.7–3.1)
Lymphs: 39 %
MCH: 31.5 pg (ref 26.6–33.0)
MCHC: 32.9 g/dL (ref 31.5–35.7)
MCV: 96 fL (ref 79–97)
Monocytes Absolute: 0.7 10*3/uL (ref 0.1–0.9)
Monocytes: 9 %
Neutrophils Absolute: 3.4 10*3/uL (ref 1.4–7.0)
Neutrophils: 48 %
Platelets: 302 10*3/uL (ref 150–450)
RBC: 4.35 x10E6/uL (ref 3.77–5.28)
RDW: 12.7 % (ref 11.7–15.4)
WBC: 7.1 10*3/uL (ref 3.4–10.8)

## 2023-10-19 LAB — LIPID PANEL W/O CHOL/HDL RATIO
Cholesterol, Total: 222 mg/dL — ABNORMAL HIGH (ref 100–199)
HDL: 54 mg/dL (ref >39)
LDL Chol Calc (NIH): 136 mg/dL — ABNORMAL HIGH (ref 0–99)
Triglycerides: 181 mg/dL — ABNORMAL HIGH (ref 0–149)
VLDL Cholesterol Cal: 32 mg/dL (ref 5–40)

## 2023-10-19 LAB — COMPREHENSIVE METABOLIC PANEL
ALT: 35 [IU]/L — ABNORMAL HIGH (ref 0–32)
AST: 28 [IU]/L (ref 0–40)
Albumin: 4.4 g/dL (ref 3.8–4.9)
Alkaline Phosphatase: 45 [IU]/L (ref 44–121)
BUN/Creatinine Ratio: 21 (ref 9–23)
BUN: 18 mg/dL (ref 6–24)
Bilirubin Total: <0.2 mg/dL (ref 0.0–1.2)
CO2: 26 mmol/L (ref 20–29)
Calcium: 9.3 mg/dL (ref 8.7–10.2)
Chloride: 102 mmol/L (ref 96–106)
Creatinine, Ser: 0.86 mg/dL (ref 0.57–1.00)
Globulin, Total: 2.4 g/dL (ref 1.5–4.5)
Glucose: 106 mg/dL — ABNORMAL HIGH (ref 70–99)
Potassium: 4.4 mmol/L (ref 3.5–5.2)
Sodium: 141 mmol/L (ref 134–144)
Total Protein: 6.8 g/dL (ref 6.0–8.5)
eGFR: 78 mL/min/{1.73_m2} (ref >59)

## 2023-10-19 LAB — TSH: TSH: 3.4 u[IU]/mL (ref 0.450–4.500)

## 2023-10-23 LAB — CYTOLOGY - PAP
Adequacy: ABSENT
Comment: NEGATIVE
Diagnosis: NEGATIVE
High risk HPV: NEGATIVE

## 2023-10-23 NOTE — Telephone Encounter (Signed)
 This encounter was created in error - please disregard.

## 2024-01-08 ENCOUNTER — Other Ambulatory Visit: Payer: Self-pay | Admitting: Family Medicine

## 2024-01-08 DIAGNOSIS — Z1231 Encounter for screening mammogram for malignant neoplasm of breast: Secondary | ICD-10-CM

## 2024-02-21 ENCOUNTER — Encounter (HOSPITAL_COMMUNITY): Payer: Self-pay

## 2024-02-28 ENCOUNTER — Ambulatory Visit
Admission: RE | Admit: 2024-02-28 | Discharge: 2024-02-28 | Disposition: A | Discharge status: Home / Self Care | Source: Ambulatory Visit | Attending: Family Medicine | Admitting: Family Medicine

## 2024-02-28 DIAGNOSIS — Z1231 Encounter for screening mammogram for malignant neoplasm of breast: Secondary | ICD-10-CM | POA: Diagnosis present

## 2024-03-06 ENCOUNTER — Ambulatory Visit: Payer: Self-pay | Admitting: Family Medicine

## 2024-03-11 NOTE — Progress Notes (Signed)
 Letter printed and mailed.

## 2024-04-14 ENCOUNTER — Encounter: Payer: Self-pay | Admitting: Family Medicine

## 2024-04-14 ENCOUNTER — Ambulatory Visit: Payer: Self-pay | Admitting: Family Medicine

## 2024-04-14 VITALS — BP 150/80 | HR 76 | Wt 216.6 lb

## 2024-04-14 DIAGNOSIS — E782 Mixed hyperlipidemia: Secondary | ICD-10-CM

## 2024-04-14 DIAGNOSIS — J4520 Mild intermittent asthma, uncomplicated: Secondary | ICD-10-CM | POA: Diagnosis not present

## 2024-04-14 DIAGNOSIS — Z789 Other specified health status: Secondary | ICD-10-CM

## 2024-04-14 DIAGNOSIS — R202 Paresthesia of skin: Secondary | ICD-10-CM

## 2024-04-14 DIAGNOSIS — F338 Other recurrent depressive disorders: Secondary | ICD-10-CM | POA: Diagnosis not present

## 2024-04-14 LAB — BAYER DCA HB A1C WAIVED: HB A1C (BAYER DCA - WAIVED): 5.6 % (ref 4.8–5.6)

## 2024-04-14 MED ORDER — GABAPENTIN 100 MG PO CAPS
100.0000 mg | ORAL_CAPSULE | Freq: Every day | ORAL | 3 refills | Status: DC
Start: 2024-04-14 — End: 2024-06-10

## 2024-04-14 MED ORDER — BUPROPION HCL ER (SR) 150 MG PO TB12: 300.0000 mg | ORAL_TABLET | Freq: Two times a day (BID) | ORAL | 1 refills | Status: DC

## 2024-04-14 NOTE — Assessment & Plan Note (Signed)
 Under good control on current regimen. Continue current regimen. Continue to monitor. Call with any concerns. Refills given.

## 2024-04-14 NOTE — Assessment & Plan Note (Signed)
 Stable. Continue to follow with pulmonology. Call with any concerns.  

## 2024-04-14 NOTE — Progress Notes (Signed)
 BP (!) 150/80   Pulse 76   Wt 216 lb 9.6 oz (98.2 kg)   LMP  (LMP Unknown)   SpO2 98%   BMI 43.03 kg/m    Subjective:    Patient ID: Marissa Reynolds, female    DOB: 1966-05-19, 58 y.o.   MRN: [id number]  HPI: Marissa Reynolds is a 58 y.o. female  Chief Complaint  Patient presents with   Hyperlipidemia   Seasonal affective disorder    DEPRESSION Mood status: controlled Satisfied with current treatment?: yes Symptom severity: mild  Duration of current treatment : chronic Side effects: no Medication compliance: excellent compliance Psychotherapy/counseling: no  Previous psychiatric medications: wellbutrin  Depressed mood: no Anxious mood: no Anhedonia: no Significant weight loss or gain: no Insomnia: no  Fatigue: no Feelings of worthlessness or guilt: no Impaired concentration/indecisiveness: no Suicidal ideations: no Hopelessness: no Crying spells: no    10/18/2023    3:31 PM 01/23/2023    4:24 PM 12/11/2022    4:14 PM 11/27/2022    4:20 PM 11/08/2022    1:11 PM  Depression screen PHQ 2/9  Decreased Interest 0 0 0 1   Down, Depressed, Hopeless 0 0 0 1 0  PHQ - 2 Score 0 0 0 2 0  Altered sleeping 0 0 0 3 1  Tired, decreased energy 0 0 0 1 1  Change in appetite 0 0 0 0 0  Feeling bad or failure about yourself  0 0 0 0 0  Trouble concentrating 0 0 0 0 0  Moving slowly or fidgety/restless 0 0 0 0 0  Suicidal thoughts 0 0 0 0 0  PHQ-9 Score 0 0 0 6 2  Difficult doing work/chores Not difficult at all Not difficult at all Not difficult at all Somewhat difficult Not difficult at all    HYPERLIPIDEMIA Hyperlipidemia status: excellent compliance Satisfied with current treatment?  yes Side effects:  no Medication compliance: N/A Past cholesterol meds: none Supplements: none Aspirin:  no The 10-year ASCVD risk score (Arnett DK, et al., 2019) is: 4.5%   Values used to calculate the score:     Age: 39 years     Clincally relevant sex: Female     Is Non-Hispanic African  American: No     Diabetic: No     Tobacco smoker: No     Systolic Blood Pressure: 150 mmHg     Is BP treated: No     HDL Cholesterol: 54 mg/dL     Total Cholesterol: 222 mg/dL Chest pain:  no Coronary artery disease:  no  NUMBNESS Duration: chronic Onset: gradual Location: pinkies and ring fingers Bilateral: yes Symmetric: no Decreased sensation: yes  Weakness: no Pain: yes Quality:  pins and needles Severity: moderate  Frequency: intermittent Trauma: no Recent illness: no Diabetes: no Thyroid  disease: no  HIV: no  Alcoholism: no  Spinal cord injury: no Status: worse Treatments attempted: shaking it out   Relevant past medical, surgical, family and social history reviewed and updated as indicated. Interim medical history since our last visit reviewed. Allergies and medications reviewed and updated.  Review of Systems  Constitutional: Negative.   Respiratory: Negative.    Cardiovascular: Negative.   Musculoskeletal: Negative.   Neurological:  Positive for numbness. Negative for dizziness, tremors, seizures, syncope, facial asymmetry, speech difficulty, weakness, light-headedness and headaches.       Feeling more unsteady on her feet  Psychiatric/Behavioral: Negative.      Per HPI unless specifically indicated above  Objective:    BP (!) 150/80   Pulse 76   Wt 216 lb 9.6 oz (98.2 kg)   LMP  (LMP Unknown)   SpO2 98%   BMI 43.03 kg/m   Wt Readings from Last 3 Encounters:  04/14/24 216 lb 9.6 oz (98.2 kg)  10/18/23 212 lb 6.4 oz (96.3 kg)  11/08/22 228 lb 3.2 oz (103.5 kg)    Physical Exam Vitals and nursing note reviewed.  Constitutional:      General: She is not in acute distress.    Appearance: Normal appearance. She is not ill-appearing, toxic-appearing or diaphoretic.  HENT:     Head: Normocephalic and atraumatic.     Right Ear: External ear normal.     Left Ear: External ear normal.     Nose: Nose normal.     Mouth/Throat:     Mouth:  Mucous membranes are moist.     Pharynx: Oropharynx is clear.   Eyes:     General: No scleral icterus.       Right eye: No discharge.        Left eye: No discharge.     Extraocular Movements: Extraocular movements intact.     Conjunctiva/sclera: Conjunctivae normal.     Pupils: Pupils are equal, round, and reactive to light.    Cardiovascular:     Rate and Rhythm: Normal rate and regular rhythm.     Pulses: Normal pulses.     Heart sounds: Normal heart sounds. No murmur heard.    No friction rub. No gallop.  Pulmonary:     Effort: Pulmonary effort is normal. No respiratory distress.     Breath sounds: Normal breath sounds. No stridor. No wheezing, rhonchi or rales.  Chest:     Chest wall: No tenderness.   Musculoskeletal:        General: Normal range of motion.     Cervical back: Normal range of motion and neck supple.   Skin:    General: Skin is warm and dry.     Capillary Refill: Capillary refill takes less than 2 seconds.     Coloration: Skin is not jaundiced or pale.     Findings: No bruising, erythema, lesion or rash.   Neurological:     General: No focal deficit present.     Mental Status: She is alert and oriented to person, place, and time. Mental status is at baseline.   Psychiatric:        Mood and Affect: Mood normal.        Behavior: Behavior normal.        Thought Content: Thought content normal.        Judgment: Judgment normal.     Results for orders placed or performed in visit on 10/18/23  Cytology - PAP   Collection Time: 10/18/23  3:45 PM  Result Value Ref Range   High risk HPV Negative    Adequacy      Satisfactory for evaluation; transformation zone component ABSENT.   Diagnosis      - Negative for intraepithelial lesion or malignancy (NILM)   Comment Normal Reference Range HPV - Negative   CBC with Differential/Platelet   Collection Time: 10/18/23  3:53 PM  Result Value Ref Range   WBC 7.1 3.4 - 10.8 x10E3/uL   RBC 4.35 3.77 - 5.28  x10E6/uL   Hemoglobin 13.7 11.1 - 15.9 g/dL   Hematocrit 58.3 65.9 - 46.6 %   MCV 96 79 - 97 fL  MCH 31.5 26.6 - 33.0 pg   MCHC 32.9 31.5 - 35.7 g/dL   RDW 87.2 88.2 - 84.5 %   Platelets 302 150 - 450 x10E3/uL   Neutrophils 48 Not Estab. %   Lymphs 39 Not Estab. %   Monocytes 9 Not Estab. %   Eos 3 Not Estab. %   Basos 1 Not Estab. %   Neutrophils Absolute 3.4 1.4 - 7.0 x10E3/uL   Lymphocytes Absolute 2.7 0.7 - 3.1 x10E3/uL   Monocytes Absolute 0.7 0.1 - 0.9 x10E3/uL   EOS (ABSOLUTE) 0.2 0.0 - 0.4 x10E3/uL   Basophils Absolute 0.1 0.0 - 0.2 x10E3/uL   Immature Granulocytes 0 Not Estab. %   Immature Grans (Abs) 0.0 0.0 - 0.1 x10E3/uL  Comprehensive metabolic panel   Collection Time: 10/18/23  3:53 PM  Result Value Ref Range   Glucose 106 (H) 70 - 99 mg/dL   BUN 18 6 - 24 mg/dL   Creatinine, Ser 9.13 0.57 - 1.00 mg/dL   eGFR 78 >40 fO/fpw/8.26   BUN/Creatinine Ratio 21 9 - 23   Sodium 141 134 - 144 mmol/L   Potassium 4.4 3.5 - 5.2 mmol/L   Chloride 102 96 - 106 mmol/L   CO2 26 20 - 29 mmol/L   Calcium 9.3 8.7 - 10.2 mg/dL   Total Protein 6.8 6.0 - 8.5 g/dL   Albumin 4.4 3.8 - 4.9 g/dL   Globulin, Total 2.4 1.5 - 4.5 g/dL   Bilirubin Total <9.7 0.0 - 1.2 mg/dL   Alkaline Phosphatase 45 44 - 121 IU/L   AST 28 0 - 40 IU/L   ALT 35 (H) 0 - 32 IU/L  Lipid Panel w/o Chol/HDL Ratio   Collection Time: 10/18/23  3:53 PM  Result Value Ref Range   Cholesterol, Total 222 (H) 100 - 199 mg/dL   Triglycerides 818 (H) 0 - 149 mg/dL   HDL 54 >60 mg/dL   VLDL Cholesterol Cal 32 5 - 40 mg/dL   LDL Chol Calc (NIH) 863 (H) 0 - 99 mg/dL  TSH   Collection Time: 10/18/23  3:53 PM  Result Value Ref Range   TSH 3.400 0.450 - 4.500 uIU/mL      Assessment & Plan:   Problem List Items Addressed This Visit       Respiratory   Mild intermittent asthma without complication   Stable. Continue to follow with pulmonology. Call with any concerns.         Other   Hyperlipidemia    Rechecking labs today. Await results. Treat as needed.       Relevant Orders   Comprehensive metabolic panel with GFR   Lipid Panel w/o Chol/HDL Ratio   Seasonal affective disorder (HCC)   Under good control on current regimen. Continue current regimen. Continue to monitor. Call with any concerns. Refills given.        Relevant Medications   buPROPion  (WELLBUTRIN  SR) 150 MG 12 hr tablet   Other Visit Diagnoses       Paresthesias    -  Primary   Concern for cubital tunnel vs cervical radiculopathy. Will check x-rays. Await results. Will start gabapentin. Call with any concerns.   Relevant Orders   VITAMIN D 25 Hydroxy (Vit-D Deficiency, Fractures)   TSH   Bayer DCA Hb A1c Waived   DG Lumbar Spine Complete   DG Cervical Spine Complete   DG Shoulder Right     Hepatitis B virus serologic status unknown  Labs drawn today. Await results.   Relevant Orders   Hepatitis B surface antibody,quantitative        Follow up plan: Return in about 6 weeks (around 05/26/2024).

## 2024-04-14 NOTE — Assessment & Plan Note (Signed)
 Rechecking labs today. Await results. Treat as needed.

## 2024-04-15 ENCOUNTER — Ambulatory Visit: Payer: Self-pay | Admitting: Family Medicine

## 2024-04-15 LAB — LIPID PANEL W/O CHOL/HDL RATIO
Cholesterol, Total: 218 mg/dL — ABNORMAL HIGH (ref 100–199)
HDL: 50 mg/dL (ref >39)
LDL Chol Calc (NIH): 125 mg/dL — ABNORMAL HIGH (ref 0–99)
Triglycerides: 247 mg/dL — ABNORMAL HIGH (ref 0–149)
VLDL Cholesterol Cal: 43 mg/dL — ABNORMAL HIGH (ref 5–40)

## 2024-04-15 LAB — COMPREHENSIVE METABOLIC PANEL WITH GFR
ALT: 36 IU/L — ABNORMAL HIGH (ref 0–32)
AST: 27 IU/L (ref 0–40)
Albumin: 4.5 g/dL (ref 3.8–4.9)
Alkaline Phosphatase: 48 IU/L (ref 44–121)
BUN/Creatinine Ratio: 24 — ABNORMAL HIGH (ref 9–23)
BUN: 18 mg/dL (ref 6–24)
Bilirubin Total: 0.2 mg/dL (ref 0.0–1.2)
CO2: 23 mmol/L (ref 20–29)
Calcium: 9.7 mg/dL (ref 8.7–10.2)
Chloride: 100 mmol/L (ref 96–106)
Creatinine, Ser: 0.76 mg/dL (ref 0.57–1.00)
Globulin, Total: 2.7 g/dL (ref 1.5–4.5)
Glucose: 71 mg/dL (ref 70–99)
Potassium: 4.7 mmol/L (ref 3.5–5.2)
Sodium: 140 mmol/L (ref 134–144)
Total Protein: 7.2 g/dL (ref 6.0–8.5)
eGFR: 90 mL/min/{1.73_m2} (ref >59)

## 2024-04-15 LAB — VITAMIN D 25 HYDROXY (VIT D DEFICIENCY, FRACTURES): Vit D, 25-Hydroxy: 36.5 ng/mL (ref 30.0–100.0)

## 2024-04-15 LAB — HEPATITIS B SURFACE ANTIBODY, QUANTITATIVE: Hepatitis B Surf Ab Quant: <3.5 m[IU]/mL — ABNORMAL LOW

## 2024-04-15 LAB — TSH: TSH: 3.19 u[IU]/mL (ref 0.450–4.500)

## 2024-05-28 ENCOUNTER — Encounter: Payer: Self-pay | Admitting: Family Medicine

## 2024-06-06 ENCOUNTER — Ambulatory Visit: Admitting: Family Medicine

## 2024-06-10 ENCOUNTER — Ambulatory Visit
Admission: RE | Admit: 2024-06-10 | Discharge: 2024-06-10 | Disposition: A | Discharge status: Home / Self Care | Source: Ambulatory Visit | Attending: Family Medicine | Admitting: Family Medicine

## 2024-06-10 ENCOUNTER — Ambulatory Visit: Admitting: Family Medicine

## 2024-06-10 ENCOUNTER — Encounter: Payer: Self-pay | Admitting: Family Medicine

## 2024-06-10 VITALS — BP 134/70 | HR 75 | Temp 98.0°F | Ht 59.5 in | Wt 215.4 lb

## 2024-06-10 DIAGNOSIS — R202 Paresthesia of skin: Secondary | ICD-10-CM

## 2024-06-10 MED ORDER — GABAPENTIN 100 MG PO CAPS: 100.0000 mg | ORAL_CAPSULE | Freq: Every day | ORAL | 3 refills | Status: AC

## 2024-06-10 NOTE — Progress Notes (Signed)
 BP 134/70   Pulse 75   Temp 98 F (36.7 C) (Oral)   Ht 4' 11.5 (1.511 m)   Wt 215 lb 6.4 oz (97.7 kg)   LMP  (LMP Unknown)   SpO2 95%   BMI 42.78 kg/m    Subjective:    Patient ID: Marissa Reynolds, female    DOB: April 01, 1966, 58 y.o.   MRN: [id number]  HPI: Marissa Reynolds is a 58 y.o. female  Chief Complaint  Patient presents with   Numbness    Bilat hand numbness. Little better with the gabapentin     NUMBNESS Duration: chronic Onset: gradual Location: pinkies and ring fingers  Bilateral: yes Symmetric: no Decreased sensation: yes  Weakness: no Pain: yes Quality:  pins and needles Severity: moderate  Frequency: intermittent Trauma: no Recent illness: no Diabetes: no Thyroid  disease: no  HIV: no  Alcoholism: no  Spinal cord injury: no Status: better Treatments attempted: shaking it out, gabapentin    Relevant past medical, surgical, family and social history reviewed and updated as indicated. Interim medical history since our last visit reviewed. Allergies and medications reviewed and updated.  Review of Systems  Constitutional: Negative.   Respiratory: Negative.    Cardiovascular: Negative.   Musculoskeletal: Negative.   Skin: Negative.   Neurological:  Positive for weakness and numbness. Negative for dizziness, tremors, seizures, syncope, facial asymmetry, speech difficulty, light-headedness and headaches.  Psychiatric/Behavioral: Negative.      Per HPI unless specifically indicated above     Objective:    BP 134/70   Pulse 75   Temp 98 F (36.7 C) (Oral)   Ht 4' 11.5 (1.511 m)   Wt 215 lb 6.4 oz (97.7 kg)   LMP  (LMP Unknown)   SpO2 95%   BMI 42.78 kg/m   Wt Readings from Last 3 Encounters:  06/10/24 215 lb 6.4 oz (97.7 kg)  04/14/24 216 lb 9.6 oz (98.2 kg)  10/18/23 212 lb 6.4 oz (96.3 kg)    Physical Exam Vitals and nursing note reviewed.  Constitutional:      General: She is not in acute distress.    Appearance: Normal appearance.  She is not ill-appearing, toxic-appearing or diaphoretic.  HENT:     Head: Normocephalic and atraumatic.     Right Ear: External ear normal.     Left Ear: External ear normal.     Nose: Nose normal.     Mouth/Throat:     Mouth: Mucous membranes are moist.     Pharynx: Oropharynx is clear.  Eyes:     General: No scleral icterus.       Right eye: No discharge.        Left eye: No discharge.     Extraocular Movements: Extraocular movements intact.     Conjunctiva/sclera: Conjunctivae normal.     Pupils: Pupils are equal, round, and reactive to light.  Cardiovascular:     Rate and Rhythm: Normal rate and regular rhythm.     Pulses: Normal pulses.     Heart sounds: Normal heart sounds. No murmur heard.    No friction rub. No gallop.  Pulmonary:     Effort: Pulmonary effort is normal. No respiratory distress.     Breath sounds: Normal breath sounds. No stridor. No wheezing, rhonchi or rales.  Chest:     Chest wall: No tenderness.  Musculoskeletal:        General: Normal range of motion.     Cervical back: Normal range of motion and neck  supple.  Skin:    General: Skin is warm and dry.     Capillary Refill: Capillary refill takes less than 2 seconds.     Coloration: Skin is not jaundiced or pale.     Findings: No bruising, erythema, lesion or rash.  Neurological:     General: No focal deficit present.     Mental Status: She is alert and oriented to person, place, and time. Mental status is at baseline.  Psychiatric:        Mood and Affect: Mood normal.        Behavior: Behavior normal.        Thought Content: Thought content normal.        Judgment: Judgment normal.     Results for orders placed or performed in visit on 04/14/24  Comprehensive metabolic panel with GFR   Collection Time: 04/14/24  4:03 PM  Result Value Ref Range   Glucose 71 70 - 99 mg/dL   BUN 18 6 - 24 mg/dL   Creatinine, Ser 9.23 0.57 - 1.00 mg/dL   eGFR 90 >40 fO/fpw/8.26   BUN/Creatinine Ratio 24 (H)  9 - 23   Sodium 140 134 - 144 mmol/L   Potassium 4.7 3.5 - 5.2 mmol/L   Chloride 100 96 - 106 mmol/L   CO2 23 20 - 29 mmol/L   Calcium 9.7 8.7 - 10.2 mg/dL   Total Protein 7.2 6.0 - 8.5 g/dL   Albumin 4.5 3.8 - 4.9 g/dL   Globulin, Total 2.7 1.5 - 4.5 g/dL   Bilirubin Total 0.2 0.0 - 1.2 mg/dL   Alkaline Phosphatase 48 44 - 121 IU/L   AST 27 0 - 40 IU/L   ALT 36 (H) 0 - 32 IU/L  Lipid Panel w/o Chol/HDL Ratio   Collection Time: 04/14/24  4:03 PM  Result Value Ref Range   Cholesterol, Total 218 (H) 100 - 199 mg/dL   Triglycerides 752 (H) 0 - 149 mg/dL   HDL 50 >60 mg/dL   VLDL Cholesterol Cal 43 (H) 5 - 40 mg/dL   LDL Chol Calc (NIH) 874 (H) 0 - 99 mg/dL  VITAMIN D  25 Hydroxy (Vit-D Deficiency, Fractures)   Collection Time: 04/14/24  4:03 PM  Result Value Ref Range   Vit D, 25-Hydroxy 36.5 30.0 - 100.0 ng/mL  TSH   Collection Time: 04/14/24  4:03 PM  Result Value Ref Range   TSH 3.190 0.450 - 4.500 uIU/mL  Bayer DCA Hb A1c Waived   Collection Time: 04/14/24  4:03 PM  Result Value Ref Range   HB A1C (BAYER DCA - WAIVED) 5.6 4.8 - 5.6 %  Hepatitis B surface antibody,quantitative   Collection Time: 04/14/24  4:03 PM  Result Value Ref Range   Hepatitis B Surf Ab Quant <3.5 (L) Immunity>10 mIU/mL      Assessment & Plan:   Problem List Items Addressed This Visit   None Visit Diagnoses       Paresthesias    -  Primary   Better on the gabapentin , but not 100%. Will increase dose and send her to get x-rays. Call with any concerns.        Follow up plan: Return in about 4 months (around 10/20/2024) for physical.

## 2024-06-22 ENCOUNTER — Encounter: Payer: Self-pay | Admitting: Family Medicine

## 2024-06-23 NOTE — Progress Notes (Signed)
 Letter printed and mailed.

## 2024-08-17 ENCOUNTER — Other Ambulatory Visit: Payer: Self-pay | Admitting: Family Medicine

## 2024-08-19 NOTE — Telephone Encounter (Signed)
 Requested Prescriptions  Refused Prescriptions Disp Refills   gabapentin  (NEURONTIN ) 100 MG capsule [Pharmacy Med Name: Gabapentin  100 MG Oral Capsule] 30 capsule 0    Sig: Take 1 capsule by mouth at bedtime     Neurology: Anticonvulsants - gabapentin  Passed - 08/19/2024  1:00 PM      Passed - Cr in normal range and within 360 days    Creatinine, Ser  Date Value Ref Range Status  04/14/2024 0.76 0.57 - 1.00 mg/dL Final         Passed - Completed PHQ-2 or PHQ-9 in the last 360 days      Passed - Valid encounter within last 12 months    Recent Outpatient Visits           2 months ago Paresthesias   [place] [facility] [place], Megan P, DO   4 months ago Paresthesias   [place] [facility] [place], [place], DO

## 2024-09-26 ENCOUNTER — Encounter: Payer: Self-pay | Admitting: Family Medicine

## 2024-10-12 ENCOUNTER — Other Ambulatory Visit: Payer: Self-pay | Admitting: Family Medicine

## 2024-10-14 ENCOUNTER — Encounter: Admitting: Family Medicine

## 2024-10-14 ENCOUNTER — Other Ambulatory Visit (HOSPITAL_COMMUNITY): Payer: Self-pay

## 2024-10-14 ENCOUNTER — Telehealth: Payer: Self-pay

## 2024-10-14 NOTE — Telephone Encounter (Signed)
 Pharmacy Patient Advocate Encounter   Received notification from Onbase that prior authorization for buPROPion  HCl ER (SR) 150MG  er tablets is required/requested.   Insurance verification completed.   The patient is insured through [employer] .   Per test claim: PA required; PA submitted to above mentioned insurance via Latent Key/confirmation #/EOC [facility] Status is pending

## 2024-10-17 ENCOUNTER — Other Ambulatory Visit: Payer: Self-pay | Admitting: Family Medicine

## 2024-10-17 NOTE — Telephone Encounter (Signed)
 PA team- can this be appealed per Dr. Vicci please?

## 2024-10-17 NOTE — Telephone Encounter (Signed)
 Pharmacy Patient Advocate Encounter  Received notification from [facility] [employer]  that Prior Authorization for buPROPion  HCl ER (SR) 150MG  er tablets  has been DENIED.  Full denial letter will be uploaded to the media tab. See denial reason below.   PA #/Case ID/Reference #: [id number]

## 2024-10-17 NOTE — Telephone Encounter (Signed)
 This medication doesn't come in 300mg  short acting and it is an acceptable dose- she was not controlled on 150mg . We will likely need to appeal this.

## 2024-10-20 ENCOUNTER — Other Ambulatory Visit: Payer: Self-pay | Admitting: Family Medicine

## 2024-10-20 ENCOUNTER — Other Ambulatory Visit (HOSPITAL_COMMUNITY): Payer: Self-pay

## 2024-10-20 NOTE — Telephone Encounter (Signed)
 Copied from CRM [id number]. Topic: Clinical - Medication Refill >> Oct 20, 2024 12:18 PM Zy'onna H wrote: Medication: buPROPion  (WELLBUTRIN  SR) 150 MG 12 hr tablet  **Patient stated she has no refills left on the Rx and wanted to know if she can get it renewed, as she is almost out.  She currently doesn't have insurance and is unsure if she can pay out of pocket for her appt.  **Please Advise patient on best options** Has the patient contacted their pharmacy? Yes (Agent: If no, request that the patient contact the pharmacy for the refill. If patient does not wish to contact the pharmacy document the reason why and proceed with request.) (Agent: If yes, when and what did the pharmacy advise?)  This is the patient's preferred pharmacy:  [facility] [address] [place] - [address] [place] [address] Phone: [phone] Fax: [phone]  Is this the correct pharmacy for this prescription? Yes If no, delete pharmacy and type the correct one.   Has the prescription been filled recently? Yes  Is the patient out of the medication? Yes  Has the patient been seen for an appointment in the last year OR does the patient have an upcoming appointment? Yes  Can we respond through MyChart? Yes  Agent: Please be advised that Rx refills may take up to 3 business days. We ask that you follow-up with your pharmacy.

## 2024-10-21 ENCOUNTER — Other Ambulatory Visit (HOSPITAL_COMMUNITY): Payer: Self-pay

## 2024-10-21 ENCOUNTER — Telehealth: Payer: Self-pay | Admitting: Pharmacist

## 2024-10-21 NOTE — Telephone Encounter (Signed)
 Requested Prescriptions  Pending Prescriptions Disp Refills   buPROPion  (WELLBUTRIN  SR) 150 MG 12 hr tablet 360 tablet 0    Sig: Take 2 tablets (300 mg total) by mouth 2 (two) times daily.     Psychiatry: Antidepressants - bupropion  Failed - 10/21/2024  4:28 PM      Failed - ALT in normal range and within 360 days    ALT  Date Value Ref Range Status  04/14/2024 36 (H) 0 - 32 IU/L Final         Passed - Cr in normal range and within 360 days    Creatinine, Ser  Date Value Ref Range Status  04/14/2024 0.76 0.57 - 1.00 mg/dL Final         Passed - AST in normal range and within 360 days    AST  Date Value Ref Range Status  04/14/2024 27 0 - 40 IU/L Final         Passed - Completed PHQ-2 or PHQ-9 in the last 360 days      Passed - Last BP in normal range    BP Readings from Last 1 Encounters:  06/10/24 134/70         Passed - Valid encounter within last 6 months    Recent Outpatient Visits           4 months ago Paresthesias   [name] [facility] [name], Megan P, DO   6 months ago Paresthesias   [place] [facility] [place], [place], DO

## 2024-10-21 NOTE — Telephone Encounter (Signed)
 We received a request to submit an appeal for bupropion  SR 150 mg, two tablets twice daily; however, per the telephone encounter dated 10/20/2024, the patient does not currently have active insurance coverage. Please advise on how you would like to proceed.  Thank you, Devere Pandy, PharmD Clinical Pharmacist  [place]  Direct Dial: [phone]

## 2024-10-23 ENCOUNTER — Encounter: Admitting: Family Medicine
